# Patient Record
Sex: Female | Born: 1950 | Race: Black or African American | Hispanic: No | Marital: Single | Smoking: Never smoker
Health system: Southern US, Community
[De-identification: ages and names within clinical notes are randomized; demographics above are authoritative.]

## PROBLEM LIST (undated history)

## (undated) DIAGNOSIS — I10 Essential (primary) hypertension: Secondary | ICD-10-CM

## (undated) DIAGNOSIS — E119 Type 2 diabetes mellitus without complications: Secondary | ICD-10-CM

---

## 2019-12-19 ENCOUNTER — Emergency Department (HOSPITAL_BASED_OUTPATIENT_CLINIC_OR_DEPARTMENT_OTHER): Payer: Medicare Other

## 2019-12-19 ENCOUNTER — Inpatient Hospital Stay (HOSPITAL_BASED_OUTPATIENT_CLINIC_OR_DEPARTMENT_OTHER)
Admission: EM | Admit: 2019-12-19 | Discharge: 2019-12-21 | DRG: 638 | Disposition: A | Discharge status: Home / Self Care | Payer: Medicare Other | Attending: Internal Medicine | Admitting: Internal Medicine

## 2019-12-19 ENCOUNTER — Encounter (HOSPITAL_BASED_OUTPATIENT_CLINIC_OR_DEPARTMENT_OTHER): Payer: Self-pay | Admitting: *Deleted

## 2019-12-19 DIAGNOSIS — I422 Other hypertrophic cardiomyopathy: Secondary | ICD-10-CM | POA: Diagnosis not present

## 2019-12-19 DIAGNOSIS — Z91128 Patient's intentional underdosing of medication regimen for other reason: Secondary | ICD-10-CM

## 2019-12-19 DIAGNOSIS — E860 Dehydration: Secondary | ICD-10-CM | POA: Diagnosis not present

## 2019-12-19 DIAGNOSIS — E876 Hypokalemia: Secondary | ICD-10-CM | POA: Diagnosis present

## 2019-12-19 DIAGNOSIS — N390 Urinary tract infection, site not specified: Secondary | ICD-10-CM | POA: Diagnosis present

## 2019-12-19 DIAGNOSIS — T383X6A Underdosing of insulin and oral hypoglycemic [antidiabetic] drugs, initial encounter: Secondary | ICD-10-CM | POA: Diagnosis present

## 2019-12-19 DIAGNOSIS — R778 Other specified abnormalities of plasma proteins: Secondary | ICD-10-CM | POA: Diagnosis present

## 2019-12-19 DIAGNOSIS — R7989 Other specified abnormal findings of blood chemistry: Secondary | ICD-10-CM | POA: Diagnosis present

## 2019-12-19 DIAGNOSIS — J9811 Atelectasis: Secondary | ICD-10-CM | POA: Diagnosis not present

## 2019-12-19 DIAGNOSIS — Z20822 Contact with and (suspected) exposure to covid-19: Secondary | ICD-10-CM | POA: Diagnosis present

## 2019-12-19 DIAGNOSIS — N179 Acute kidney failure, unspecified: Secondary | ICD-10-CM | POA: Diagnosis present

## 2019-12-19 DIAGNOSIS — I10 Essential (primary) hypertension: Secondary | ICD-10-CM | POA: Diagnosis not present

## 2019-12-19 DIAGNOSIS — E1110 Type 2 diabetes mellitus with ketoacidosis without coma: Secondary | ICD-10-CM | POA: Diagnosis present

## 2019-12-19 DIAGNOSIS — R9389 Abnormal findings on diagnostic imaging of other specified body structures: Secondary | ICD-10-CM

## 2019-12-19 DIAGNOSIS — T465X6A Underdosing of other antihypertensive drugs, initial encounter: Secondary | ICD-10-CM | POA: Diagnosis present

## 2019-12-19 DIAGNOSIS — I160 Hypertensive urgency: Secondary | ICD-10-CM | POA: Diagnosis not present

## 2019-12-19 DIAGNOSIS — Z9114 Patient's other noncompliance with medication regimen: Secondary | ICD-10-CM

## 2019-12-19 DIAGNOSIS — E1100 Type 2 diabetes mellitus with hyperosmolarity without nonketotic hyperglycemic-hyperosmolar coma (NKHHC): Secondary | ICD-10-CM

## 2019-12-19 HISTORY — DX: Essential (primary) hypertension: I10

## 2019-12-19 HISTORY — DX: Type 2 diabetes mellitus without complications: E11.9

## 2019-12-19 LAB — URINALYSIS, ROUTINE W REFLEX MICROSCOPIC
Bilirubin Urine: NEGATIVE
Glucose, UA: >=500 mg/dL — AB
Ketones, ur: 40 mg/dL — AB
Nitrite: NEGATIVE
Protein, ur: NEGATIVE mg/dL
Specific Gravity, Urine: 1.015 (ref 1.005–1.030)
pH: 5.5 (ref 5.0–8.0)

## 2019-12-19 LAB — OSMOLALITY: Osmolality: 319 mOsm/kg — ABNORMAL HIGH (ref 275–295)

## 2019-12-19 LAB — COMPREHENSIVE METABOLIC PANEL
ALT: 35 U/L (ref 0–44)
AST: 19 U/L (ref 15–41)
Albumin: 3.7 g/dL (ref 3.5–5.0)
Alkaline Phosphatase: 54 U/L (ref 38–126)
Anion gap: 21 — ABNORMAL HIGH (ref 5–15)
BUN: 33 mg/dL — ABNORMAL HIGH (ref 8–23)
CO2: 23 mmol/L (ref 22–32)
Calcium: 10.3 mg/dL (ref 8.9–10.3)
Chloride: 95 mmol/L — ABNORMAL LOW (ref 98–111)
Creatinine, Ser: 1.65 mg/dL — ABNORMAL HIGH (ref 0.44–1.00)
GFR calc Af Amer: 37 mL/min — ABNORMAL LOW (ref >60)
GFR calc non Af Amer: 32 mL/min — ABNORMAL LOW (ref >60)
Glucose, Bld: 592 mg/dL (ref 70–99)
Potassium: 3.7 mmol/L (ref 3.5–5.1)
Sodium: 139 mmol/L (ref 135–145)
Total Bilirubin: 1.3 mg/dL — ABNORMAL HIGH (ref 0.3–1.2)
Total Protein: 6.5 g/dL (ref 6.5–8.1)

## 2019-12-19 LAB — POCT I-STAT EG7
Acid-Base Excess: 3 mmol/L — ABNORMAL HIGH (ref 0.0–2.0)
Bicarbonate: 27.8 mmol/L (ref 20.0–28.0)
Calcium, Ion: 1.16 mmol/L (ref 1.15–1.40)
HCT: 49 % — ABNORMAL HIGH (ref 36.0–46.0)
Hemoglobin: 16.7 g/dL — ABNORMAL HIGH (ref 12.0–15.0)
O2 Saturation: 81 %
Patient temperature: 98.4
Potassium: 5.6 mmol/L — ABNORMAL HIGH (ref 3.5–5.1)
Sodium: 135 mmol/L (ref 135–145)
TCO2: 29 mmol/L (ref 22–32)
pCO2, Ven: 41.8 mmHg — ABNORMAL LOW (ref 44.0–60.0)
pH, Ven: 7.429 (ref 7.250–7.430)
pO2, Ven: 44 mmHg (ref 32.0–45.0)

## 2019-12-19 LAB — CBC WITH DIFFERENTIAL/PLATELET
Abs Immature Granulocytes: 0.06 10*3/uL (ref 0.00–0.07)
Basophils Absolute: 0 10*3/uL (ref 0.0–0.1)
Basophils Relative: 0 %
Eosinophils Absolute: 0 10*3/uL (ref 0.0–0.5)
Eosinophils Relative: 0 %
HCT: 45.4 % (ref 36.0–46.0)
Hemoglobin: 15 g/dL (ref 12.0–15.0)
Immature Granulocytes: 1 %
Lymphocytes Relative: 8 %
Lymphs Abs: 0.6 10*3/uL — ABNORMAL LOW (ref 0.7–4.0)
MCH: 31.1 pg (ref 26.0–34.0)
MCHC: 33 g/dL (ref 30.0–36.0)
MCV: 94 fL (ref 80.0–100.0)
Monocytes Absolute: 0.6 10*3/uL (ref 0.1–1.0)
Monocytes Relative: 9 %
Neutro Abs: 6 10*3/uL (ref 1.7–7.7)
Neutrophils Relative %: 82 %
Platelets: 110 10*3/uL — ABNORMAL LOW (ref 150–400)
RBC: 4.83 MIL/uL (ref 3.87–5.11)
RDW: 14.2 % (ref 11.5–15.5)
WBC: 7.3 10*3/uL (ref 4.0–10.5)
nRBC: 0 % (ref 0.0–0.2)

## 2019-12-19 LAB — LACTIC ACID, PLASMA
Lactic Acid, Venous: 2.6 mmol/L (ref 0.5–1.9)
Lactic Acid, Venous: 2.2 mmol/L (ref 0.5–1.9)

## 2019-12-19 LAB — CBG MONITORING, ED
Glucose-Capillary: 258 mg/dL — ABNORMAL HIGH (ref 70–99)
Glucose-Capillary: 259 mg/dL — ABNORMAL HIGH (ref 70–99)
Glucose-Capillary: 387 mg/dL — ABNORMAL HIGH (ref 70–99)
Glucose-Capillary: 464 mg/dL — ABNORMAL HIGH (ref 70–99)
Glucose-Capillary: 525 mg/dL (ref 70–99)
Glucose-Capillary: 591 mg/dL (ref 70–99)

## 2019-12-19 LAB — BASIC METABOLIC PANEL
Anion gap: 19 — ABNORMAL HIGH (ref 5–15)
BUN: 28 mg/dL — ABNORMAL HIGH (ref 8–23)
CO2: 24 mmol/L (ref 22–32)
Calcium: 9.5 mg/dL (ref 8.9–10.3)
Chloride: 99 mmol/L (ref 98–111)
Creatinine, Ser: 1.31 mg/dL — ABNORMAL HIGH (ref 0.44–1.00)
GFR calc Af Amer: 48 mL/min — ABNORMAL LOW (ref >60)
GFR calc non Af Amer: 42 mL/min — ABNORMAL LOW (ref >60)
Glucose, Bld: 355 mg/dL — ABNORMAL HIGH (ref 70–99)
Potassium: 3.9 mmol/L (ref 3.5–5.1)
Sodium: 142 mmol/L (ref 135–145)

## 2019-12-19 LAB — SARS CORONAVIRUS 2 AG (30 MIN TAT): SARS Coronavirus 2 Ag: NEGATIVE

## 2019-12-19 LAB — TROPONIN I (HIGH SENSITIVITY): Troponin I (High Sensitivity): 56 ng/L — ABNORMAL HIGH (ref <18)

## 2019-12-19 LAB — URINALYSIS, MICROSCOPIC (REFLEX)

## 2019-12-19 LAB — BRAIN NATRIURETIC PEPTIDE: B Natriuretic Peptide: 50.1 pg/mL (ref 0.0–100.0)

## 2019-12-19 MED ORDER — SODIUM CHLORIDE 0.9 % IV BOLUS
1000.0000 mL | Freq: Once | INTRAVENOUS | Status: AC
  Administered 2019-12-19: 1000 mL via INTRAVENOUS

## 2019-12-19 MED ORDER — LABETALOL HCL 5 MG/ML IV SOLN
10.0000 mg | Freq: Once | INTRAVENOUS | Status: AC
  Administered 2019-12-19: 10 mg via INTRAVENOUS
  Filled 2019-12-19: qty 4

## 2019-12-19 MED ORDER — INSULIN ASPART 100 UNIT/ML ~~LOC~~ SOLN: 5.0000 [IU] | Freq: Once | SUBCUTANEOUS | Status: DC

## 2019-12-19 MED ORDER — DEXTROSE 50 % IV SOLN: 0.0000 mL | INTRAVENOUS | Status: DC | PRN

## 2019-12-19 MED ORDER — CEPHALEXIN 500 MG PO CAPS
500.0000 mg | ORAL_CAPSULE | Freq: Two times a day (BID) | ORAL | Status: DC
  Administered 2019-12-19 – 2019-12-21 (×4): 500 mg via ORAL
  Filled 2019-12-19 (×4): qty 1
  Filled 2019-12-19: qty 2

## 2019-12-19 MED ORDER — INSULIN REGULAR(HUMAN) IN NACL 100-0.9 UT/100ML-% IV SOLN
INTRAVENOUS | Status: DC
  Administered 2019-12-19: 5.5 [IU]/h via INTRAVENOUS
  Filled 2019-12-19: qty 100

## 2019-12-19 MED ORDER — DEXTROSE-NACL 5-0.45 % IV SOLN: INTRAVENOUS | Status: DC

## 2019-12-19 MED ORDER — POTASSIUM CHLORIDE 10 MEQ/100ML IV SOLN
10.0000 meq | INTRAVENOUS | Status: AC
  Administered 2019-12-19 (×2): 10 meq via INTRAVENOUS
  Filled 2019-12-19 (×2): qty 100

## 2019-12-19 MED ORDER — SODIUM CHLORIDE 0.9 % IV SOLN: INTRAVENOUS | Status: DC

## 2019-12-19 NOTE — ED Triage Notes (Signed)
HTN, hyperglycemia x months. Noncompliant with meds. Also reports no taste x 1 week. Denies loss of sense of smell.

## 2019-12-19 NOTE — ED Notes (Signed)
[facility] to transport pt at this time. Advised [facility] that [facility] recommended Insulin to be increased to 3 units/hr.

## 2019-12-19 NOTE — ED Notes (Signed)
Date and time results received: 12/19/19 1942 (use smartphrase ".now" to insert current time)  Test: glucose Critical Value: 592  Name of Provider Notified: Dr. Clarene Duke  Orders Received? Or Actions Taken?: awaiting orders

## 2019-12-19 NOTE — ED Notes (Signed)
Date and time results received: 12/19/19 1934 (use smartphrase ".now" to insert current time)  Test: lactic Critical Value: 2.6  Name of Provider Notified: Dr. Clarene Duke  Orders Received? Or Actions Taken?: no new orders

## 2019-12-19 NOTE — ED Notes (Signed)
Attempted IV to right AC and right forearm, unable to obtain.

## 2019-12-19 NOTE — ED Provider Notes (Signed)
[facility] EMERGENCY DEPARTMENT Provider Note   CSN: [id number] Arrival date & time: 12/19/19  1731     History Chief Complaint  Patient presents with  . Hypertension    Jeanne Rose is a 69 y.o. female.  69 year old female with past medical history including hypertension and type 2 diabetes mellitus who presents with hypertension and hyperglycemia.  Daughter reports that the patient has been noncompliant with any of her medications for months, preferring to try natural remedies instead.  Daughter states that she does not believe that she has diabetes.  Recently, the patient has had polyuria and polydipsia.  Daughter thinks that she has lost a few pounds recently.  No vomiting or diarrhea.  She notes that she has not been able to taste but thinks that this is due to her severely dry mouth.  She denies any loss of smell, cough, sore throat, or fevers.  She denies any chest pain or breathing problems. She has had some LE edema recently.   The history is provided by the patient and a relative.  Hypertension       Past Medical History:  Diagnosis Date  . Diabetes mellitus without complication (HCC)   . Hypertension     There are no problems to display for this patient.   History reviewed. No pertinent surgical history.   OB History   No obstetric history on file.     History reviewed. No pertinent family history.  Social History   Tobacco Use  . Smoking status: Never Smoker  . Smokeless tobacco: Never Used  Substance Use Topics  . Alcohol use: Never  . Drug use: Never    Home Medications Prior to Admission medications   Not on File    Allergies    Patient has no known allergies.  Review of Systems   Review of Systems All other systems reviewed and are negative except that which was mentioned in HPI  Physical Exam Updated Vital Signs BP (!) 203/99 (BP Location: Left Arm)   Pulse 98   Temp 98.6 F (37 C) (Oral)   Resp 18   SpO2 99%    Physical Exam Vitals and nursing note reviewed.  Constitutional:      General: She is not in acute distress.    Appearance: She is well-developed.  HENT:     Head: Normocephalic and atraumatic.     Mouth/Throat:     Mouth: Mucous membranes are dry.  Eyes:     Conjunctiva/sclera: Conjunctivae normal.  Cardiovascular:     Rate and Rhythm: Regular rhythm. Tachycardia present.     Heart sounds: Normal heart sounds. No murmur.  Pulmonary:     Effort: Pulmonary effort is normal.     Breath sounds: Normal breath sounds.  Abdominal:     General: Bowel sounds are normal. There is no distension.     Palpations: Abdomen is soft.     Tenderness: There is no abdominal tenderness.  Musculoskeletal:     Cervical back: Neck supple.     Right lower leg: Edema present.     Left lower leg: Edema present.     Comments: Mild pitting edema BLE at ankles  Skin:    General: Skin is warm and dry.  Neurological:     Mental Status: She is alert and oriented to person, place, and time.     Comments: Fluent speech  Psychiatric:        Judgment: Judgment normal.     ED Results /  Procedures / Treatments   Labs (all labs ordered are listed, but only abnormal results are displayed) Labs Reviewed  COMPREHENSIVE METABOLIC PANEL - Abnormal; Notable for the following components:      Result Value   Chloride 95 (*)    Glucose, Bld 592 (*)    BUN 33 (*)    Creatinine, Ser 1.65 (*)    Total Bilirubin 1.3 (*)    GFR calc non Af Amer 32 (*)    GFR calc Af Amer 37 (*)    Anion gap 21 (*)    All other components within normal limits  CBC WITH DIFFERENTIAL/PLATELET - Abnormal; Notable for the following components:   Platelets 110 (*)    Lymphs Abs 0.6 (*)    All other components within normal limits  URINALYSIS, ROUTINE W REFLEX MICROSCOPIC - Abnormal; Notable for the following components:   APPearance CLOUDY (*)    Glucose, UA >=500 (*)    Hgb urine dipstick SMALL (*)    Ketones, ur 40 (*)     Leukocytes,Ua TRACE (*)    All other components within normal limits  LACTIC ACID, PLASMA - Abnormal; Notable for the following components:   Lactic Acid, Venous 2.6 (*)    All other components within normal limits  LACTIC ACID, PLASMA - Abnormal; Notable for the following components:   Lactic Acid, Venous 2.2 (*)    All other components within normal limits  URINALYSIS, MICROSCOPIC (REFLEX) - Abnormal; Notable for the following components:   Bacteria, UA MANY (*)    All other components within normal limits  BASIC METABOLIC PANEL - Abnormal; Notable for the following components:   Glucose, Bld 355 (*)    BUN 28 (*)    Creatinine, Ser 1.31 (*)    GFR calc non Af Amer 42 (*)    GFR calc Af Amer 48 (*)    Anion gap 19 (*)    All other components within normal limits  CBG MONITORING, ED - Abnormal; Notable for the following components:   Glucose-Capillary 591 (*)    All other components within normal limits  POCT I-STAT EG7 - Abnormal; Notable for the following components:   pCO2, Ven 41.8 (*)    Acid-Base Excess 3.0 (*)    Potassium 5.6 (*)    HCT 49.0 (*)    Hemoglobin 16.7 (*)    All other components within normal limits  CBG MONITORING, ED - Abnormal; Notable for the following components:   Glucose-Capillary 525 (*)    All other components within normal limits  CBG MONITORING, ED - Abnormal; Notable for the following components:   Glucose-Capillary 464 (*)    All other components within normal limits  CBG MONITORING, ED - Abnormal; Notable for the following components:   Glucose-Capillary 387 (*)    All other components within normal limits  TROPONIN I (HIGH SENSITIVITY) - Abnormal; Notable for the following components:   Troponin I (High Sensitivity) 56 (*)    All other components within normal limits  SARS CORONAVIRUS 2 AG (30 MIN TAT)  SARS CORONAVIRUS 2 (TAT 6-24 HRS)  URINE CULTURE  BRAIN NATRIURETIC PEPTIDE  OSMOLALITY  BASIC METABOLIC PANEL  BASIC METABOLIC  PANEL  BASIC METABOLIC PANEL  I-STAT VENOUS BLOOD GAS, ED    EKG None  Radiology DG Chest Port 1 View  Result Date: 12/19/2019 CLINICAL DATA:  69 year old female with hypertension and edema. EXAM: PORTABLE CHEST 1 VIEW COMPARISON:  None. FINDINGS: There is a 3.8 x 2.7  cm focal opacity in the right infrahilar region 60 which may be artifactual and related to vascular confluence. An infrahilar mass or infiltrate is not excluded. Clinical correlation and follow-up with radiograph to resolution or further evaluation with chest CT recommended. There is no pleural effusion or pneumothorax. The cardiac silhouette is within normal limits. No acute osseous pathology. IMPRESSION: Focal right infrahilar density as described. Clinical correlation is recommended. Electronically Signed   By: Elgie Collard M.D.   On: 12/19/2019 19:23    Procedures .Critical Care Performed by: Laurence Spates, MD Authorized by: Laurence Spates, MD   Critical care provider statement:    Critical care time (minutes):  30   Critical care time was exclusive of:  Separately billable procedures and treating other patients   Critical care was necessary to treat or prevent imminent or life-threatening deterioration of the following conditions:  Endocrine crisis and dehydration   Critical care was time spent personally by me on the following activities:  Development of treatment plan with patient or surrogate, evaluation of patient's response to treatment, examination of patient, obtaining history from patient or surrogate, ordering and performing treatments and interventions, ordering and review of laboratory studies, ordering and review of radiographic studies and re-evaluation of patient's condition   (including critical care time)  Medications Ordered in ED Medications - No data to display  ED Course  I have reviewed the triage vital signs and the nursing notes.  Pertinent labs & imaging results that were  available during my care of the patient were reviewed by me and considered in my medical decision making (see chart for details).    MDM Rules/Calculators/A&P                      Non-toxic on exam, alert. BP 203/99. BG 591, normal venous pH.  UA with some signs of infection and a few ketones, added urine culture and will cover for infection with Keflex.  Blood work shows creatinine 1.65 with no recent for comparison, BUN 33, anion gap 21, blood glucose 590, troponin 56 although no symptoms suggestive of ACS, lactate 2.6.  Gave the patient fluid bolus and started on insulin through Endo [facility].  Lab work suggests dehydration with HHS rather than DKA.  Chest x-ray shows right infrahilar density.  Because of her AKI, I feel it would be best to hold off on contrasted CT chest for now and hydrate the patient, waiting for improvement in kidney function before obtaining CT for better evaluation.  She has no respiratory symptoms currently.  Discussed admission with [facility] hospitalist, Dr. Toniann Fail.  Final Clinical Impression(s) / ED Diagnoses Final diagnoses:  Type 2 diabetes mellitus with hyperosmolar hyperglycemic state (HHS) (HCC)  AKI (acute kidney injury) (HCC)  Hypertensive urgency  Non compliance w medication regimen    Rx / DC Orders ED Discharge Orders    None       Katerina Zurn, Ambrose [place], MD 12/19/19 2240

## 2019-12-20 ENCOUNTER — Observation Stay (HOSPITAL_COMMUNITY): Payer: Medicare Other

## 2019-12-20 ENCOUNTER — Encounter (HOSPITAL_COMMUNITY): Payer: Self-pay | Admitting: Internal Medicine

## 2019-12-20 ENCOUNTER — Other Ambulatory Visit: Payer: Self-pay

## 2019-12-20 DIAGNOSIS — R778 Other specified abnormalities of plasma proteins: Secondary | ICD-10-CM

## 2019-12-20 DIAGNOSIS — T465X6A Underdosing of other antihypertensive drugs, initial encounter: Secondary | ICD-10-CM | POA: Diagnosis present

## 2019-12-20 DIAGNOSIS — E860 Dehydration: Secondary | ICD-10-CM

## 2019-12-20 DIAGNOSIS — I160 Hypertensive urgency: Secondary | ICD-10-CM

## 2019-12-20 DIAGNOSIS — J9811 Atelectasis: Secondary | ICD-10-CM | POA: Diagnosis present

## 2019-12-20 DIAGNOSIS — E1110 Type 2 diabetes mellitus with ketoacidosis without coma: Secondary | ICD-10-CM | POA: Diagnosis present

## 2019-12-20 DIAGNOSIS — E1165 Type 2 diabetes mellitus with hyperglycemia: Secondary | ICD-10-CM | POA: Diagnosis not present

## 2019-12-20 DIAGNOSIS — R9389 Abnormal findings on diagnostic imaging of other specified body structures: Secondary | ICD-10-CM

## 2019-12-20 DIAGNOSIS — E1100 Type 2 diabetes mellitus with hyperosmolarity without nonketotic hyperglycemic-hyperosmolar coma (NKHHC): Secondary | ICD-10-CM

## 2019-12-20 DIAGNOSIS — T383X6A Underdosing of insulin and oral hypoglycemic [antidiabetic] drugs, initial encounter: Secondary | ICD-10-CM | POA: Diagnosis present

## 2019-12-20 DIAGNOSIS — N179 Acute kidney failure, unspecified: Secondary | ICD-10-CM | POA: Diagnosis present

## 2019-12-20 DIAGNOSIS — E876 Hypokalemia: Secondary | ICD-10-CM | POA: Diagnosis present

## 2019-12-20 DIAGNOSIS — I10 Essential (primary) hypertension: Secondary | ICD-10-CM

## 2019-12-20 DIAGNOSIS — Z20822 Contact with and (suspected) exposure to covid-19: Secondary | ICD-10-CM | POA: Diagnosis present

## 2019-12-20 DIAGNOSIS — I422 Other hypertrophic cardiomyopathy: Secondary | ICD-10-CM | POA: Diagnosis present

## 2019-12-20 DIAGNOSIS — N390 Urinary tract infection, site not specified: Secondary | ICD-10-CM | POA: Diagnosis present

## 2019-12-20 DIAGNOSIS — Z91128 Patient's intentional underdosing of medication regimen for other reason: Secondary | ICD-10-CM | POA: Diagnosis not present

## 2019-12-20 DIAGNOSIS — Z9114 Patient's other noncompliance with medication regimen: Secondary | ICD-10-CM

## 2019-12-20 LAB — BASIC METABOLIC PANEL
Anion gap: 15 (ref 5–15)
Anion gap: 12 (ref 5–15)
BUN: 22 mg/dL (ref 8–23)
BUN: 20 mg/dL (ref 8–23)
CO2: 26 mmol/L (ref 22–32)
CO2: 26 mmol/L (ref 22–32)
Calcium: 9 mg/dL (ref 8.9–10.3)
Calcium: 8.8 mg/dL — ABNORMAL LOW (ref 8.9–10.3)
Chloride: 102 mmol/L (ref 98–111)
Chloride: 103 mmol/L (ref 98–111)
Creatinine, Ser: 0.91 mg/dL (ref 0.44–1.00)
Creatinine, Ser: 0.84 mg/dL (ref 0.44–1.00)
GFR calc Af Amer: >60 mL/min (ref >60)
GFR calc Af Amer: >60 mL/min (ref >60)
GFR calc non Af Amer: >60 mL/min (ref >60)
GFR calc non Af Amer: >60 mL/min (ref >60)
Glucose, Bld: 177 mg/dL — ABNORMAL HIGH (ref 70–99)
Glucose, Bld: 179 mg/dL — ABNORMAL HIGH (ref 70–99)
Potassium: 3.4 mmol/L — ABNORMAL LOW (ref 3.5–5.1)
Potassium: 3.8 mmol/L (ref 3.5–5.1)
Sodium: 143 mmol/L (ref 135–145)
Sodium: 141 mmol/L (ref 135–145)

## 2019-12-20 LAB — MAGNESIUM: Magnesium: 2 mg/dL (ref 1.7–2.4)

## 2019-12-20 LAB — GLUCOSE, CAPILLARY
Glucose-Capillary: 137 mg/dL — ABNORMAL HIGH (ref 70–99)
Glucose-Capillary: 153 mg/dL — ABNORMAL HIGH (ref 70–99)
Glucose-Capillary: 164 mg/dL — ABNORMAL HIGH (ref 70–99)
Glucose-Capillary: 169 mg/dL — ABNORMAL HIGH (ref 70–99)
Glucose-Capillary: 170 mg/dL — ABNORMAL HIGH (ref 70–99)
Glucose-Capillary: 176 mg/dL — ABNORMAL HIGH (ref 70–99)
Glucose-Capillary: 180 mg/dL — ABNORMAL HIGH (ref 70–99)
Glucose-Capillary: 187 mg/dL — ABNORMAL HIGH (ref 70–99)
Glucose-Capillary: 192 mg/dL — ABNORMAL HIGH (ref 70–99)
Glucose-Capillary: 195 mg/dL — ABNORMAL HIGH (ref 70–99)
Glucose-Capillary: 235 mg/dL — ABNORMAL HIGH (ref 70–99)
Glucose-Capillary: 292 mg/dL — ABNORMAL HIGH (ref 70–99)

## 2019-12-20 LAB — CBC WITH DIFFERENTIAL/PLATELET
Abs Immature Granulocytes: 0.07 10*3/uL (ref 0.00–0.07)
Basophils Absolute: 0 10*3/uL (ref 0.0–0.1)
Basophils Relative: 0 %
Eosinophils Absolute: 0 10*3/uL (ref 0.0–0.5)
Eosinophils Relative: 0 %
HCT: 43.1 % (ref 36.0–46.0)
Hemoglobin: 13.7 g/dL (ref 12.0–15.0)
Immature Granulocytes: 1 %
Lymphocytes Relative: 22 %
Lymphs Abs: 1.6 10*3/uL (ref 0.7–4.0)
MCH: 30.2 pg (ref 26.0–34.0)
MCHC: 31.8 g/dL (ref 30.0–36.0)
MCV: 94.9 fL (ref 80.0–100.0)
Monocytes Absolute: 0.8 10*3/uL (ref 0.1–1.0)
Monocytes Relative: 10 %
Neutro Abs: 4.9 10*3/uL (ref 1.7–7.7)
Neutrophils Relative %: 67 %
Platelets: 93 10*3/uL — ABNORMAL LOW (ref 150–400)
RBC: 4.54 MIL/uL (ref 3.87–5.11)
RDW: 14.1 % (ref 11.5–15.5)
WBC: 7.3 10*3/uL (ref 4.0–10.5)
nRBC: 0 % (ref 0.0–0.2)

## 2019-12-20 LAB — HIV ANTIBODY (ROUTINE TESTING W REFLEX): HIV Screen 4th Generation wRfx: NONREACTIVE

## 2019-12-20 LAB — LACTIC ACID, PLASMA
Lactic Acid, Venous: 1.7 mmol/L (ref 0.5–1.9)
Lactic Acid, Venous: 1.6 mmol/L (ref 0.5–1.9)

## 2019-12-20 LAB — SODIUM, URINE, RANDOM: Sodium, Ur: 174 mmol/L

## 2019-12-20 LAB — ECHOCARDIOGRAM COMPLETE
Height: 63 in
Weight: 2927.71 oz

## 2019-12-20 LAB — SARS CORONAVIRUS 2 (TAT 6-24 HRS): SARS Coronavirus 2: NEGATIVE

## 2019-12-20 LAB — TROPONIN I (HIGH SENSITIVITY)
Troponin I (High Sensitivity): 65 ng/L — ABNORMAL HIGH (ref <18)
Troponin I (High Sensitivity): 71 ng/L — ABNORMAL HIGH (ref <18)

## 2019-12-20 LAB — MRSA PCR SCREENING: MRSA by PCR: NEGATIVE

## 2019-12-20 LAB — HEMOGLOBIN A1C
Hgb A1c MFr Bld: 16.6 % — ABNORMAL HIGH (ref 4.8–5.6)
Mean Plasma Glucose: 429.72 mg/dL

## 2019-12-20 LAB — TSH: TSH: 2.359 u[IU]/mL (ref 0.350–4.500)

## 2019-12-20 LAB — PHOSPHORUS: Phosphorus: 1.8 mg/dL — ABNORMAL LOW (ref 2.5–4.6)

## 2019-12-20 LAB — BETA-HYDROXYBUTYRIC ACID: Beta-Hydroxybutyric Acid: 1.48 mmol/L — ABNORMAL HIGH (ref 0.05–0.27)

## 2019-12-20 MED ORDER — AMLODIPINE BESYLATE 10 MG PO TABS
10.0000 mg | ORAL_TABLET | Freq: Every day | ORAL | Status: DC
  Administered 2019-12-20: 10 mg via ORAL
  Filled 2019-12-20: qty 1

## 2019-12-20 MED ORDER — INSULIN GLARGINE 100 UNIT/ML ~~LOC~~ SOLN: 10.0000 [IU] | Freq: Every day | SUBCUTANEOUS | Status: DC

## 2019-12-20 MED ORDER — INSULIN REGULAR(HUMAN) IN NACL 100-0.9 UT/100ML-% IV SOLN
INTRAVENOUS | Status: DC
  Administered 2019-12-20: 1.4 [IU]/h via INTRAVENOUS

## 2019-12-20 MED ORDER — ORAL CARE MOUTH RINSE
15.0000 mL | Freq: Two times a day (BID) | OROMUCOSAL | Status: DC
  Administered 2019-12-20 (×3): 15 mL via OROMUCOSAL

## 2019-12-20 MED ORDER — ONDANSETRON HCL 4 MG/2ML IJ SOLN: 4.0000 mg | Freq: Four times a day (QID) | INTRAMUSCULAR | Status: DC | PRN

## 2019-12-20 MED ORDER — METOPROLOL TARTRATE 12.5 MG HALF TABLET
12.5000 mg | ORAL_TABLET | Freq: Two times a day (BID) | ORAL | Status: DC
  Administered 2019-12-20: 12.5 mg via ORAL
  Filled 2019-12-20: qty 1

## 2019-12-20 MED ORDER — HYDROCODONE-ACETAMINOPHEN 5-325 MG PO TABS: 1.0000 | ORAL_TABLET | ORAL | Status: DC | PRN

## 2019-12-20 MED ORDER — INSULIN GLARGINE 100 UNIT/ML ~~LOC~~ SOLN
15.0000 [IU] | Freq: Every day | SUBCUTANEOUS | Status: DC
  Administered 2019-12-21: 15 [IU] via SUBCUTANEOUS
  Filled 2019-12-20: qty 0.15

## 2019-12-20 MED ORDER — SODIUM CHLORIDE 0.9 % IV SOLN: INTRAVENOUS | Status: DC

## 2019-12-20 MED ORDER — ACETAMINOPHEN 650 MG RE SUPP: 650.0000 mg | Freq: Four times a day (QID) | RECTAL | Status: DC | PRN

## 2019-12-20 MED ORDER — SODIUM CHLORIDE 0.9% FLUSH
10.0000 mL | INTRAVENOUS | Status: DC | PRN
  Administered 2019-12-21: 10 mL

## 2019-12-20 MED ORDER — ONDANSETRON HCL 4 MG PO TABS: 4.0000 mg | ORAL_TABLET | Freq: Four times a day (QID) | ORAL | Status: DC | PRN

## 2019-12-20 MED ORDER — SODIUM PHOSPHATES 45 MMOLE/15ML IV SOLN
30.0000 mmol | Freq: Once | INTRAVENOUS | Status: AC
  Administered 2019-12-20: 30 mmol via INTRAVENOUS
  Filled 2019-12-20: qty 10

## 2019-12-20 MED ORDER — INSULIN ASPART 100 UNIT/ML ~~LOC~~ SOLN
2.0000 [IU] | Freq: Three times a day (TID) | SUBCUTANEOUS | Status: DC
  Administered 2019-12-20 – 2019-12-21 (×3): 2 [IU] via SUBCUTANEOUS

## 2019-12-20 MED ORDER — ACETAMINOPHEN 325 MG PO TABS: 650.0000 mg | ORAL_TABLET | Freq: Four times a day (QID) | ORAL | Status: DC | PRN

## 2019-12-20 MED ORDER — ENOXAPARIN SODIUM 40 MG/0.4ML ~~LOC~~ SOLN
40.0000 mg | SUBCUTANEOUS | Status: DC
  Administered 2019-12-20: 40 mg via SUBCUTANEOUS
  Filled 2019-12-20: qty 0.4

## 2019-12-20 MED ORDER — LIVING WELL WITH DIABETES BOOK
Freq: Once | Status: AC
  Filled 2019-12-20: qty 1

## 2019-12-20 MED ORDER — POTASSIUM CHLORIDE 10 MEQ/100ML IV SOLN
10.0000 meq | INTRAVENOUS | Status: AC
  Administered 2019-12-20 (×2): 10 meq via INTRAVENOUS
  Filled 2019-12-20 (×2): qty 100

## 2019-12-20 MED ORDER — INSULIN GLARGINE 100 UNIT/ML ~~LOC~~ SOLN
12.0000 [IU] | Freq: Every day | SUBCUTANEOUS | Status: DC
  Administered 2019-12-20: 12 [IU] via SUBCUTANEOUS
  Filled 2019-12-20: qty 0.12

## 2019-12-20 MED ORDER — AMLODIPINE BESYLATE 5 MG PO TABS: 5.0000 mg | ORAL_TABLET | Freq: Every day | ORAL | Status: DC

## 2019-12-20 MED ORDER — METOPROLOL SUCCINATE ER 50 MG PO TB24
50.0000 mg | ORAL_TABLET | Freq: Every day | ORAL | Status: DC
  Administered 2019-12-20 – 2019-12-21 (×2): 50 mg via ORAL
  Filled 2019-12-20 (×2): qty 1

## 2019-12-20 MED ORDER — HYDRALAZINE HCL 20 MG/ML IJ SOLN: 10.0000 mg | INTRAMUSCULAR | Status: DC | PRN

## 2019-12-20 MED ORDER — SODIUM CHLORIDE 0.9% FLUSH
3.0000 mL | Freq: Two times a day (BID) | INTRAVENOUS | Status: DC
  Administered 2019-12-20 (×2): 3 mL via INTRAVENOUS

## 2019-12-20 MED ORDER — INSULIN GLARGINE 100 UNIT/ML ~~LOC~~ SOLN
5.0000 [IU] | Freq: Once | SUBCUTANEOUS | Status: AC
  Administered 2019-12-20: 5 [IU] via SUBCUTANEOUS
  Filled 2019-12-20 (×2): qty 0.05

## 2019-12-20 MED ORDER — CHLORHEXIDINE GLUCONATE CLOTH 2 % EX PADS
6.0000 | MEDICATED_PAD | Freq: Every day | CUTANEOUS | Status: DC
  Administered 2019-12-20: 6 via TOPICAL

## 2019-12-20 MED ORDER — INSULIN ASPART 100 UNIT/ML ~~LOC~~ SOLN
0.0000 [IU] | Freq: Three times a day (TID) | SUBCUTANEOUS | Status: DC
  Administered 2019-12-20: 3 [IU] via SUBCUTANEOUS
  Administered 2019-12-20: 5 [IU] via SUBCUTANEOUS
  Administered 2019-12-20: 8 [IU] via SUBCUTANEOUS
  Administered 2019-12-21: 3 [IU] via SUBCUTANEOUS
  Administered 2019-12-21: 8 [IU] via SUBCUTANEOUS

## 2019-12-20 MED ORDER — METOPROLOL TARTRATE 5 MG/5ML IV SOLN: 5.0000 mg | INTRAVENOUS | Status: DC | PRN

## 2019-12-20 MED ORDER — METOPROLOL TARTRATE 25 MG PO TABS: 25.0000 mg | ORAL_TABLET | Freq: Two times a day (BID) | ORAL | Status: DC

## 2019-12-20 MED ORDER — DOCUSATE SODIUM 100 MG PO CAPS
100.0000 mg | ORAL_CAPSULE | Freq: Two times a day (BID) | ORAL | Status: DC
  Administered 2019-12-20 – 2019-12-21 (×2): 100 mg via ORAL
  Filled 2019-12-20 (×3): qty 1

## 2019-12-20 MED ORDER — INSULIN STARTER KIT- PEN NEEDLES (ENGLISH)
1.0000 | Freq: Once | Status: DC
  Filled 2019-12-20: qty 1

## 2019-12-20 NOTE — Consult Note (Addendum)
CARDIOLOGY CONSULT NOTE  Patient ID: Sheriann Newmann MRN: [id number] DOB/AGE: [birth year] 69 y.o.  Admit date: 12/19/2019 Referring Physician: [facility] hospitalist Reason for Consultation:  Abnormal echcoardiogram  HPI:   69 y.o. [place] female  with hypertension, uncontrolled DM, admitted with DKA. Cardiology consulted for abnormal echocardiogram and elevated HS troponin.   Patient is originally from [place], moved from [place] to [place], [place] to be with her daughter 3 months ago.  She has minimal baseline medical care, last saw her physician in [place] in September 2020.  She is not on any reliable medical treatment for her diabetes at baseline.  She has Metformin tablets, but has not been compliant with it.  She was admitted to [facility] long hospital on 12/19/2019 midnight with DKA, which has since resolved.  Today, she underwent echocardiogram which was suspicious for hypertrophic cardiomyopathy.  Troponin was elevated around 70.  Patient denies any chest pain, shortness of breath, presyncope or syncope symptoms.  She denies any known family history of hypertrophic cardiomyopathy.  Past Medical History:  Diagnosis Date  . Diabetes mellitus without complication ([place])   . Hypertension      History reviewed. No pertinent surgical history.   Family History  Problem Relation Age of Onset  . Diabetes Neg Hx      Social History: Social History   Socioeconomic History  . Marital status: Single    Spouse name: Not on file  . Number of children: Not on file  . Years of education: Not on file  . Highest education level: Not on file  Occupational History  . Not on file  Tobacco Use  . Smoking status: Never Smoker  . Smokeless tobacco: Never Used  Substance and Sexual Activity  . Alcohol use: Never  . Drug use: Never  . Sexual activity: Never  Other Topics Concern  . Not on file  Social History Narrative  . Not on file   Social Determinants of Health   Financial Resource Strain:    . Difficulty of Paying Living Expenses:   Food Insecurity:   . Worried About Charity fundraiser in the Last Year:   . Arboriculturist in the Last Year:   Transportation Needs:   . Film/video editor (Medical):   Marland Kitchen Lack of Transportation (Non-Medical):   Physical Activity:   . Days of Exercise per Week:   . Minutes of Exercise per Session:   Stress:   . Feeling of Stress :   Social Connections:   . Frequency of Communication with Friends and Family:   . Frequency of Social Gatherings with Friends and Family:   . Attends Religious Services:   . Active Member of Clubs or Organizations:   . Attends Archivist Meetings:   Marland Kitchen Marital Status:   Intimate Partner Violence:   . Fear of Current or Ex-Partner:   . Emotionally Abused:   Marland Kitchen Physically Abused:   . Sexually Abused:      No medications prior to admission.    Review of Systems  Constitution: Positive for malaise/fatigue (Now improved). Negative for decreased appetite, weight gain and weight loss.  HENT: Negative for congestion.   Eyes: Negative for visual disturbance.  Cardiovascular: Negative for chest pain, dyspnea on exertion, leg swelling, palpitations and syncope.  Respiratory: Negative for cough.   Endocrine: Positive for polydipsia (Now improved) and polyuria (Now improved). Negative for cold intolerance.  Hematologic/Lymphatic: Does not bruise/bleed easily.  Skin: Negative for itching and rash.  Musculoskeletal:  Negative for myalgias.  Gastrointestinal: Negative for abdominal pain, nausea and vomiting.  Genitourinary: Negative for dysuria.  Neurological: Negative for dizziness and weakness.  Psychiatric/Behavioral: The patient is not nervous/anxious.   All other systems reviewed and are negative.     Physical Exam: Physical Exam  Constitutional: She is oriented to person, place, and time. She appears well-developed and well-nourished. No distress.  HENT:  Head: Normocephalic and atraumatic.   Eyes: Pupils are equal, round, and reactive to light. Conjunctivae are normal.  Neck: No JVD present.  Cardiovascular: Normal rate, regular rhythm and intact distal pulses.  Murmur heard. High-pitched harsh early systolic murmur is present with a grade of 2/6 at the upper right sternal border and apex. Pulmonary/Chest: Effort normal and breath sounds normal. She has no wheezes. She has no rales.  Abdominal: Soft. Bowel sounds are normal. There is no rebound.  Musculoskeletal:        General: Edema (1+ b/l) present.  Lymphadenopathy:    She has no cervical adenopathy.  Neurological: She is alert and oriented to person, place, and time. No cranial nerve deficit.  Skin: Skin is warm and dry.  Psychiatric: She has a normal mood and affect.  Nursing note and vitals reviewed.    Labs:   Lab Results  Component Value Date   WBC 7.3 12/20/2019   HGB 13.7 12/20/2019   HCT 43.1 12/20/2019   MCV 94.9 12/20/2019   PLT 93 (L) 12/20/2019    Recent Labs  Lab 12/19/19 1900 12/19/19 2122 12/20/19 0440  NA 139   < > 141  K 3.7   < > 3.8  CL 95*   < > 103  CO2 23   < > 26  BUN 33*   < > 20  CREATININE 1.65*   < > 0.84  CALCIUM 10.3   < > 8.8*  PROT 6.5  --   --   BILITOT 1.3*  --   --   ALKPHOS 54  --   --   ALT 35  --   --   AST 19  --   --   GLUCOSE 592*   < > 179*   < > = values in this interval not displayed.    Lipid Panel  No results found for: CHOL, TRIG, HDL, CHOLHDL, VLDL, LDLCALC  BNP (last 3 results) Recent Labs    12/19/19 1900  BNP 50.1    HEMOGLOBIN A1C Lab Results  Component Value Date   HGBA1C 16.6 (H) 12/20/2019   MPG 429.72 12/20/2019    Cardiac Panel (last 3 results) No results for input(s): CKTOTAL, CKMB, RELINDX in the last 8760 hours.  Invalid input(s): TROPONINHS  No results found for: CKTOTAL, CKMB, CKMBINDEX   TSH Recent Labs    12/20/19 0440  TSH 2.359      Radiology: DG Chest 2 View  Result Date: 12/20/2019 CLINICAL DATA:   Hypertension EXAM: CHEST - 2 VIEW COMPARISON:  12/19/2019 FINDINGS: There is right basilar atelectasis. There is no pleural effusion or pneumothorax. The heart and mediastinal contours are unremarkable. There is no acute osseous abnormality. 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 The visualized skeletal structures are unremarkable. IMPRESSION: Right basilar atelectasis. Electronically Signed   By: Kathreen Devoid   On: 12/20/2019 10:34   DG Chest Port 1 View  Result Date: 12/19/2019 CLINICAL DATA:  69 year old female with hypertension and edema. EXAM: PORTABLE CHEST 1 VIEW COMPARISON:  None. FINDINGS: There is a 3.8 x 2.7  cm focal opacity in the right infrahilar region 60 which may be artifactual and related to vascular confluence. An infrahilar mass or infiltrate is not excluded. Clinical correlation and follow-up with radiograph to resolution or further evaluation with chest CT recommended. There is no pleural effusion or pneumothorax. The cardiac silhouette is within normal limits. No acute osseous pathology. IMPRESSION: Focal right infrahilar density as described. Clinical correlation is recommended. Electronically Signed   By: Anner Crete M.D.   On: 12/19/2019 19:23   ECHOCARDIOGRAM COMPLETE  Result Date: 12/20/2019    ECHOCARDIOGRAM REPORT   Patient Name:   JACQUELINA HEWINS Date of Exam: 12/20/2019 Medical Rec #:  [id number]      Height:       63.0 in Accession #:    [id number]     Weight:       183.0 lb Date of Birth:  Jul 24, 1951      BSA:          1.862 m Patient Age:    51 years       BP:           172/71 mmHg Patient Gender: F              HR:           61 bpm. Exam Location:  Inpatient Procedure: 2D Echo, Cardiac Doppler and Color Doppler Indications:    Elevated troponin  History:        Patient has no prior history of Echocardiogram examinations.                 Risk Factors:Hypertension and Diabetes.  Sonographer:    Clayton Lefort RDCS (AE) Referring Phys: [place]  1. There is severe asymmetric hypertrophy of the ventricular septum (up to 1.7 cm). There is LVOT obstruction up to 37 mmHG. There appears to be chordal SAM and no mitral valve SAM is seen. There is no posteriorly directed MR that would be expected from  [facility] either. 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 Would recommend a cardiac MRI for better clarification. Left ventricular ejection fraction, by estimation, is 70 to 75%. The left ventricle has hyperdynamic function. The left ventricle has no regional wall motion abnormalities. There is severe asymmetric left ventricular hypertrophy of the basal-septal segment. Left ventricular diastolic parameters are indeterminate.  2. Right ventricular systolic function is normal. The right ventricular size is normal. There is normal pulmonary artery systolic pressure. The estimated right ventricular systolic pressure is 80.9 mmHg.  3. The mitral valve is degenerative. No evidence of mitral valve regurgitation. No evidence of mitral stenosis.  4. The aortic valve is tricuspid. Aortic valve regurgitation is not visualized. Mild aortic valve sclerosis is present, with no evidence of aortic valve stenosis.  5. The inferior vena cava is normal in size with greater than 50% respiratory variability, suggesting right atrial pressure of 3 mmHg. FINDINGS  Left Ventricle: There is severe asymmetric hypertrophy of the ventricular septum (up to 1.7 cm). There is LVOT obstruction up to 37 mmHG. There appears to be chordal SAM and no mitral valve SAM is seen. There is no posteriorly directed MR that would be expected from [facility] either. 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 Would recommend a cardiac MRI for better clarification. Left ventricular ejection fraction, by estimation, is 70 to 75%. The left ventricle has hyperdynamic function. The left ventricle has no regional wall motion abnormalities.  The left ventricular internal cavity size was  normal in size. There is severe asymmetric left ventricular hypertrophy of the basal-septal segment. Left ventricular diastolic parameters are indeterminate. Right Ventricle: The right ventricular size is normal. No increase in right ventricular wall thickness. Right ventricular systolic function is normal. There is normal pulmonary artery systolic pressure. The tricuspid regurgitant velocity is 2.47 m/s, and  with an assumed right atrial pressure of 3 mmHg, the estimated right ventricular systolic pressure is 96.7 mmHg. Left Atrium: Left atrial size was normal in size. Right Atrium: Right atrial size was normal in size. Pericardium: Trivial pericardial effusion is present. Presence of pericardial fat pad. Mitral Valve: The mitral valve is degenerative in appearance. Mild mitral annular calcification. No evidence of mitral valve regurgitation. No evidence of mitral valve stenosis. Tricuspid Valve: The tricuspid valve is grossly normal. Tricuspid valve regurgitation is trivial. No evidence of tricuspid stenosis. Aortic Valve: The aortic valve is tricuspid. . There is mild thickening and mild calcification of the aortic valve. Aortic valve regurgitation is not visualized. Mild aortic valve sclerosis is present, with no evidence of aortic valve stenosis. There is mild thickening of the aortic valve. There is mild calcification of the aortic valve. Aortic valve mean gradient measures 15.3 mmHg. Aortic valve peak gradient measures 26.1 mmHg. Pulmonic Valve: The pulmonic valve was grossly normal. Pulmonic valve regurgitation is not visualized. No evidence of pulmonic stenosis. Aorta: The aortic root is normal in size and structure. Venous: The inferior vena cava is normal in size with greater than 50% respiratory variability, suggesting right atrial pressure of 3 mmHg. IAS/Shunts: The atrial septum is grossly normal. EKG: Rhythm strip during this exam demostrated normal sinus  rhythm and premature atrial contractions.  LEFT VENTRICLE PLAX 2D LVIDd:         4.20 cm LVIDs:         2.50 cm LV PW:         1.10 cm LV IVS:        1.40 cm LVOT diam:     1.70 cm LVOT Area:     2.27 cm  RIGHT VENTRICLE             IVC RV Basal diam:  2.90 cm     IVC diam: 1.10 cm RV S prime:     19.00 cm/s TAPSE (M-mode): 2.6 cm LEFT ATRIUM             Index       RIGHT ATRIUM           Index LA diam:        2.70 cm 1.45 cm/m  RA Area:     12.70 cm LA Vol (A2C):   63.4 ml 34.05 ml/m RA Volume:   27.20 ml  14.61 ml/m LA Vol (A4C):   49.0 ml 26.32 ml/m LA Biplane Vol: 56.9 ml 30.56 ml/m  AORTIC VALVE AV Vmax:      255.33 cm/s AV Vmean:     181.333 cm/s AV VTI:       0.490 m AV Peak Grad: 26.1 mmHg AV Mean Grad: 15.3 mmHg  AORTA Ao Root diam: 2.80 cm TRICUSPID VALVE TR Peak grad:   24.4 mmHg TR Vmax:        247.00 cm/s  SHUNTS Systemic Diam: 1.70 cm Eleonore Chiquito MD Electronically signed by Eleonore Chiquito MD Signature Date/Time: 12/20/2019/11:35:38 AM    Final     Scheduled Meds: . amLODipine  10 mg Oral Daily  . cephALEXin  500 mg Oral Q12H  .  Chlorhexidine Gluconate Cloth  6 each Topical Daily  . docusate sodium  100 mg Oral BID  . enoxaparin (LOVENOX) injection  40 mg Subcutaneous Q24H  . insulin aspart  0-15 Units Subcutaneous TID WC  . insulin aspart  2 Units Subcutaneous TID WC  . [START ON 12/21/2019] insulin glargine  15 Units Subcutaneous Daily  . insulin starter kit- pen needles  1 kit Other Once  . mouth rinse  15 mL Mouth Rinse BID  . metoprolol tartrate  12.5 mg Oral BID  . sodium chloride flush  3 mL Intravenous Q12H   Continuous Infusions: . sodium chloride 100 mL/hr at 12/20/19 0915   PRN Meds:.acetaminophen **OR** acetaminophen, dextrose, hydrALAZINE, HYDROcodone-acetaminophen, ondansetron **OR** ondansetron (ZOFRAN) IV, sodium chloride flush  CARDIAC STUDIES:  EKG 12/20/2019: Sinus rhythm with Premature atrial complexes Left axis deviation Left ventricular hypertrophy  with repolarization abnormality ( R in aVL , Cornell product ) Nonspecific ST and T wave abnormality  Echocardiogram 12/20/2019:  1. There is severe asymmetric hypertrophy of the ventricular septum (up  to 1.7 cm). There is LVOT obstruction up to 37 mmHG. There appears to be  chordal SAM and no mitral valve SAM is seen. There is no posteriorly  directed MR that would be expected from  [facility] either. Overall, these findings could represent hypertrophic  obstructive cardiomyopathy. Would recommend a cardiac MRI for better  clarification. Left ventricular ejection fraction, by estimation, is 70 to  75%. The left ventricle has hyperdynamic  function. The left ventricle has no regional wall motion abnormalities.  There is severe asymmetric left ventricular hypertrophy of the  basal-septal segment. Left ventricular diastolic parameters are  indeterminate.  2. Right ventricular systolic function is normal. The right ventricular  size is normal. There is normal pulmonary artery systolic pressure. The  estimated right ventricular systolic pressure is 44.6 mmHg.  3. The mitral valve is degenerative. No evidence of mitral valve  regurgitation. No evidence of mitral stenosis.  4. The aortic valve is tricuspid. Aortic valve regurgitation is not  visualized. Mild aortic valve sclerosis is present, with no evidence of  aortic valve stenosis.  5. The inferior vena cava is normal in size with greater than 50%  respiratory variability, suggesting right atrial pressure of 3 mmHg.    Assessment & Recommendations:  69 y.o. [place] female  with hypertension, uncontrolled DM, admitted with DKA, probable hypertrophic cardiomyopathy.  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 This is incidental finding. Patient is not symptomatic from HCM. At 86, she is low risk for serious cardiac events and does not need ICD. Recommend metoprolol succinate 50 mg daily, instead of  amlodipine, for blood pressure and heart rate control and to reduce resting LVOT gradient. Okay to continue with losartan. While she does have mild edema, this may be due to combination amlodipine, IV fluid resuscitation. I would avoid aggressive diuresis at this point. I will see her for follow up in June. I will then further discuss with her re: obtaining cardiac MRI and screening first degree relatives. Strongly recommend establishing care with PCP to manage diabetes, which remains her #1 medical issue.   Troponin elevation: Due to supply demand mismatch in the setting of hypertrophic/hypertensive cardiomyopathy, DKA.  Cardiology signing off.   Nigel Mormon, MD 12/20/2019, 7:28 PM [facility] Pager: [phone] Office: [phone] If no answer Cell [phone]

## 2019-12-20 NOTE — Progress Notes (Signed)
Initial Nutrition Assessment  DOCUMENTATION CODES:   Obesity unspecified  INTERVENTION:  - introductory diet education provided--provided handouts - will monitor for additional nutrition-related needs.    NUTRITION DIAGNOSIS:   Altered nutrition lab value related to other (see comment)(medication non-compliance) as evidenced by other (comment)(hyperglycemia (DKA) and elevated BP on admission).  GOAL:   Patient will meet greater than or equal to 90% of their needs  MONITOR:   PO intake, Labs, Weight trends  REASON FOR ASSESSMENT:   Consult Assessment of nutrition requirement/status  ASSESSMENT:   69 y.o. female with medical history significant of HTN and type 2 DM. She has been non-compliant with medications and prefers natural remedies. She does not accept that she has DM. She had polyuria, polydipsia, weight loss, and dry mouth PTA. She also reported BLE edema. BP at home was in the 200s and CBG was up to 590 mg/dl.  No intakes documented since admission. Diet advanced from CLD to Carb Modified today at 0820. Ordered breakfast for patient after visit: pancakes with sugar-free syrup, scrambled eggs, peaches, and coffee.   Patient only checks CBG at home when she is feeling unwell. She lives with her daughter. She is open to checking CBG once/day but states that someone would need to do it for her; she is unsure if her daughter would be willing to do this. She only eats 1 large meal/day, in the afternoon. She is open to splitting this meal in half to eat 2 meals/day to aid in glycemic control.   She prefers natural remedies and typically uses garlic and cinnamon to control blood sugar. She does have metformin at home but does not take it typically although she did begin taking it last week with no feeling well.   She reports UBW of 188 lb and that she weighs herself frequently. She reports that on 4/19 she weighed 173 lb. Per chart review, weight yesterday was 183 lb and no other  weight hx recorded in the chart.    Labs reviewed; HgbA1c: 16.6%, CBGs: 153-192 mg/dl, Phos: 1.8 mg/dl. Medications reviewed; 100 mg colace BID, sliding scale novolog, 10 units novolog/day, 10 mEq IV KCl x2 run 4/20 and x2 runs 4/21, 30 mmol IV NaPhos x1 run 4/21. IVF; NS @ 100 ml/hr.     NUTRITION - FOCUSED PHYSICAL EXAM:  completed; no muscle or fat wasting, mild edema to extremities.   Diet Order:   Diet Order            Diet Carb Modified Fluid consistency: Thin; Room service appropriate? Yes  Diet effective now              EDUCATION NEEDS:   Education needs have been addressed  Skin:  Skin Assessment: Reviewed RN Assessment  Last BM:  PTA/unknown  Height:   Ht Readings from Last 1 Encounters:  12/19/19 5\' 3"  (1.6 m)    Weight:   Wt Readings from Last 1 Encounters:  12/19/19 83 kg    Ideal Body Weight:  52.3 kg  BMI:  Body mass index is 32.41 kg/m.  Estimated Nutritional Needs:   Kcal:  1870-2075 kcal  Protein:  85-95 grams  Fluid:  >/= 1.8 L/day     12/21/19, MS, RD, LDN, CNSC Inpatient Clinical Dietitian RD pager # available in [facility]  After hours/weekend pager # available in [facility]

## 2019-12-20 NOTE — Progress Notes (Signed)
PROGRESS NOTE    Jeanne Rose  PVX:[id number] DOB: 1951-08-25 DOA: 12/19/2019 PCP: Patient, No Pcp Per   Brief Narrative: 69 year old with past medical history of diabetes and hypertension, noncompliance with medication presented with DKA.   Assessment & Plan:   Active Problems:   DKA (diabetic ketoacidoses) (HCC)   Elevated troponin   AKI (acute kidney injury) ([facility])   Dehydration   Accelerated hypertension  1-DKA: Present with elevated anion gap, lactic acidosis, hyperglycemia ketones in urine. Treated with IV fluids, insulin drip. Gap close. Started Lantus 15 units daily, meal coverage and sliding scale insulin. HbA1c at 16  2-elevation troponin: Could be related to hypertensive urgency Echo ordered. Hypertrophy cardiomyopathy; obstructive. Cardiology consulted.  Denies chest pain.  3-AKI: Improved with IV fluids 4-dehydration: Improved with IV fluids 5-accelerated hypertension hypertension urgency: Not taking medication.  Started Norvasc and metoprolol 6-abnormal chest x-ray with focal right infrahilar density: Repeated chest x-ray: Showed right basilar atelectasis Follow-up as an outpatient 7-UTI: Continue with Keflex follow urine culture Hypophosphatemia: Replete IV Estimated body mass index is 32.41 kg/m as calculated from the following:   Height as of this encounter: _0  (1.6 m).   Weight as of this encounter: 83 kg.   DVT prophylaxis: Lovenox Code Status: Full code Family Communication: Update daughter Disposition Plan:  Patient is from: Home Anticipated d/c date: Home in 1 or 2 days Barriers to d/c or necessity for inpatient status: Continue to control blood sugar control blood pressure and further evaluation for hypertrophic cardiomyopathy  Consultants:   Cardiology  Procedures:   None  Antimicrobials:  6  Subjective: She is feeling better, she is willing to use insulin.  She reported polyuria denies abdominal pain  Objective: Vitals:   12/20/19 0500 12/20/19 0504 12/20/19 0600 12/20/19 0700  BP: (!) 173/87 (!) 155/84 (!) 164/83 (!) 169/82  Pulse: (!) 57 74 (!) 57 73  Resp: _1 Temp:      TempSrc:      SpO2: 99% 98% 98% 98%  Weight:      Height:        Intake/Output Summary (Last 24 hours) at 12/20/2019 0733 Last data filed at 12/20/2019 0600 Gross per 24 hour  Intake 2005.5 ml  Output -  Net 2005.5 ml   Filed Weights   12/19/19 2030  Weight: 83 kg    Examination:  General exam: Appears calm and comfortable  Respiratory system: Clear to auscultation. Respiratory effort normal. Cardiovascular system: S1 & S2 heard, RRR. No JVD, murmurs, rubs, gallops or clicks. No pedal edema. Gastrointestinal system: Abdomen is nondistended, soft and nontender. No organomegaly or masses felt. Normal bowel sounds heard. Central nervous system: Alert and oriented. No focal neurological deficits. Extremities: Symmetric 5 x 5 power. Skin: No rashes, lesions or ulcers    Data Reviewed: I have personally reviewed following labs and imaging studies  CBC: Recent Labs  Lab 12/19/19 1823 12/19/19 1900 12/20/19 0440  WBC  --  7.3 7.3  NEUTROABS  --  6.0 4.9  HGB 16.7* 15.0 13.7  HCT 49.0* 45.4 43.1  MCV  --  94.0 94.9  PLT  --  110* 93*   Basic Metabolic Panel: Recent Labs  Lab 12/19/19 1823 12/19/19 1900 12/19/19 2122 12/20/19 0110 12/20/19 0440  NA 135 139 142 143 141  K 5.6* 3.7 3.9 3.4* 3.8  CL  --  95* 99 102 103  CO2  --  _2 GLUCOSE  --  592* 355* 177* 179*  BUN  --  33* 28* 22 20  CREATININE  --  1.65* 1.31* 0.91 0.84  CALCIUM  --  10.3 9.5 9.0 8.8*  MG  --   --   --   --  2.0  PHOS  --   --   --   --  1.8*   GFR: Estimated Creatinine Clearance: 65.4 mL/min (by C-G formula based on SCr of 0.84 mg/dL). Liver Function Tests: Recent Labs  Lab 12/19/19 1900  AST 19  ALT 35  ALKPHOS 54  BILITOT 1.3*  PROT 6.5  ALBUMIN 3.7   No results for input(s): LIPASE, AMYLASE in the last  168 hours. No results for input(s): AMMONIA in the last 168 hours. Coagulation Profile: No results for input(s): INR, PROTIME in the last 168 hours. Cardiac Enzymes: No results for input(s): CKTOTAL, CKMB, CKMBINDEX, TROPONINI in the last 168 hours. BNP (last 3 results) No results for input(s): PROBNP in the last 8760 hours. HbA1C: No results for input(s): HGBA1C in the last 72 hours. CBG: Recent Labs  Lab 12/20/19 0245 12/20/19 0348 12/20/19 0458 12/20/19 0559 12/20/19 0651  GLUCAP 195* 170* 164* 187* 176*   Lipid Profile: No results for input(s): CHOL, HDL, LDLCALC, TRIG, CHOLHDL, LDLDIRECT in the last 72 hours. Thyroid Function Tests: Recent Labs    12/20/19 0440  TSH 2.359   Anemia Panel: No results for input(s): VITAMINB12, FOLATE, FERRITIN, TIBC, IRON, RETICCTPCT in the last 72 hours. Sepsis Labs: Recent Labs  Lab 12/19/19 1900 12/19/19 2122 12/20/19 0110 12/20/19 0440  LATICACIDVEN 2.6* 2.2* 1.7 1.6    Recent Results (from the past 240 hour(s))  SARS Coronavirus 2 Ag (30 min TAT) - Nasal Swab (BD Veritor Kit)     Status: None   Collection Time: 12/19/19  7:14 PM   Specimen: Nasal Swab (BD Veritor Kit)  Result Value Ref Range Status   SARS Coronavirus 2 Ag NEGATIVE NEGATIVE Final    Comment: (NOTE) SARS-CoV-2 antigen NOT DETECTED.  Negative results are presumptive.  Negative results do not preclude SARS-CoV-2 infection and should not be used as the sole basis for treatment or other patient management decisions, including infection  control decisions, particularly in the presence of clinical signs and  symptoms consistent with COVID-19, or in those who have been in contact with the virus.  Negative results must be combined with clinical observations, patient history, and epidemiological information. The expected result is Negative. Fact Sheet for Patients: PodPark.tn Fact Sheet for Healthcare Providers:  GiftContent.is This test is not yet approved or cleared by the [place] FDA and  has been authorized for detection and/or diagnosis of SARS-CoV-2 by FDA under an Emergency Use Authorization (EUA).  This EUA will remain in effect (meaning this test can be used) for the duration of  the COVID-19 de claration under Section 564(b)(1) of the Act, 21 U.S.C. section 360bbb-3(b)(1), unless the authorization is terminated or revoked sooner. Performed at [facility], [address] [place], [place] [address]   SARS CORONAVIRUS 2 (TAT 6-24 HRS) Nasopharyngeal Nasopharyngeal Swab     Status: None   Collection Time: 12/19/19  9:22 PM   Specimen: Nasopharyngeal Swab  Result Value Ref Range Status   SARS Coronavirus 2 NEGATIVE NEGATIVE Final    Comment: (NOTE) SARS-CoV-2 target nucleic acids are NOT DETECTED. The SARS-CoV-2 RNA is generally detectable in upper and lower respiratory specimens during the acute phase of infection. Negative results do not preclude SARS-CoV-2  infection, do not rule out co-infections with other pathogens, and should not be used as the sole basis for treatment or other patient management decisions. Negative results must be combined with clinical observations, patient history, and epidemiological information. The expected result is Negative. Fact Sheet for Patients: SugarRoll.be Fact Sheet for Healthcare Providers: https://www.woods-mathews.com/ This test is not yet approved or cleared by the [place] FDA and  has been authorized for detection and/or diagnosis of SARS-CoV-2 by FDA under an Emergency Use Authorization (EUA). This EUA will remain  in effect (meaning this test can be used) for the duration of the COVID-19 declaration under Section 56 4(b)(1) of the Act, 21 U.S.C. section 360bbb-3(b)(1), unless the authorization is terminated or revoked sooner. Performed at [facility], [place] [address] [place], [place] [address]   MRSA PCR Screening     Status: None   Collection Time: 12/20/19 12:29 AM   Specimen: Nasal Mucosa; Nasopharyngeal  Result Value Ref Range Status   MRSA by PCR NEGATIVE NEGATIVE Final    Comment:        The GeneXpert MRSA Assay (FDA approved for NASAL specimens only), is one component of a comprehensive MRSA colonization surveillance program. It is not intended to diagnose MRSA infection nor to guide or monitor treatment for MRSA infections. Performed at [facility], [place] [address] [place], [place] [address]          Radiology Studies: [facility] Chest Port 1 View  Result Date: 12/19/2019 CLINICAL DATA:  69 year old female with hypertension and edema. EXAM: PORTABLE CHEST 1 VIEW COMPARISON:  None. FINDINGS: There is a 3.8 x 2.7 cm focal opacity in the right infrahilar region 60 which may be artifactual and related to vascular confluence. An infrahilar mass or infiltrate is not excluded. Clinical correlation and follow-up with radiograph to resolution or further evaluation with chest CT recommended. There is no pleural effusion or pneumothorax. The cardiac silhouette is within normal limits. No acute osseous pathology. IMPRESSION: Focal right infrahilar density as described. Clinical correlation is recommended. Electronically Signed   By: Anner Crete M.D.   On: 12/19/2019 19:23        Scheduled Meds: . amLODipine  5 mg Oral Daily  . cephALEXin  500 mg Oral Q12H  . Chlorhexidine Gluconate Cloth  6 each Topical Daily  . docusate sodium  100 mg Oral BID  . enoxaparin (LOVENOX) injection  40 mg Subcutaneous Q24H  . insulin aspart  0-15 Units Subcutaneous TID WC  . insulin glargine  12 Units Subcutaneous Daily  . mouth rinse  15 mL Mouth Rinse BID  . sodium chloride flush  3 mL Intravenous Q12H   Continuous Infusions: . sodium chloride 100 mL/hr at 12/20/19 0600  . insulin 1.1 mL/hr at 12/20/19 0600   . sodium phosphate  Dextrose 5% IVPB       LOS: 0 days    Time spent: 35 minutes/     Kaylub Detienne A Madysen Faircloth, MD [facility]   If 7PM-7AM, please contact night-coverage [email or web address]  12/20/2019, 7:33 AM

## 2019-12-20 NOTE — Progress Notes (Signed)
  Echocardiogram 2D Echocardiogram has been performed.  Gerda Diss 12/20/2019, 9:07 AM

## 2019-12-20 NOTE — Progress Notes (Signed)
VAST NOTE: As per the Primary RN, she attempted a failed PIV start x1, thus the reason for the consult. Advised to document failed attempt.

## 2019-12-20 NOTE — H&P (Addendum)
Jeanne Rose VFI:[id number] DOB: October 14, 1950 DOA: 12/19/2019     PCP: Patient, No Pcp Per   Outpatient Specialists:  NONE    Patient arrived to ER on 12/19/19 at 1731  Patient coming from: home Lives   With family    Chief Complaint:   Chief Complaint  Patient presents with  . Hypertension  high blood sugar  HPI: Jeanne Rose is a 69 y.o. female with medical history significant of HTN, DM2,     Presented with   hypertension and hyperglycemia  patient has been noncompliant with   her medications for months, using natural remedies instead.  Patient does not accept that she has DM, Family noted inc polyuria and polydipsia, weight loss, dry mouth, reports leg edema. no CP , no SOB BP at home in 200 Her BG was up to 590 She tried to take metformin this week but bg still stayed up usually she tries to take garlic  Infectious risk factors:  Reports  fatigue    Has  NOt been vaccinated against COVID    in house  PCR testing  Pending  No results found for: SARSCOV2NAA   Regarding pertinent Chronic problems      HTN  Not on any meds Larstatin    DM 2 - not controlled, metformin    While in ER: Noted inc Cr  AG 21 Glucose 595 Possible UTI  And evidence of DKA Given IV fluid bolus and Started on IV insulin Troponin elevated to 56 CXR initially showed possible infiltrate   Hospitalist was called for admission for DKA vs HHS  The following Work up has been ordered so far:  Orders Placed This Encounter  Procedures  . Critical Care  . SARS Coronavirus 2 Ag (30 min TAT) - Nasal Swab (BD Veritor Kit)  . SARS CORONAVIRUS 2 (TAT 6-24 HRS) Nasopharyngeal Nasopharyngeal Swab  . Urine culture  . MRSA PCR Screening  . DG Chest Port 1 View  . Comprehensive metabolic panel  . CBC with Differential  . Urinalysis, Routine w reflex microscopic  . Lactic acid, plasma  . Brain natriuretic peptide  . Urinalysis, Microscopic (reflex)  . Osmolality  . Basic metabolic  panel  . Diet NPO time specified  . Cardiac monitoring  . Initiate Carrier Fluid Protocol  . Notify physician  . If present, discontinue Insulin Pump after IV Insulin is initiated.  . Do NOT use lab glucose values in EndoTool.  If CBG meter reads "Critical High", enter 600.  Marland Kitchen Upon IV fluid bolus completion, place order for STAT BMET (LAB15) and call provider with results.  . IV bolus already initiated  . Cardiac monitoring  . Complete oral assessment tool on admission, transfer and at any change in patient condition.  . Oral care with mouthwash and mouth/lip moisturizer as needed  . Perform a daily oral assessment to evaluate integrity of the oral cavity/mucosa  . Refer to Sidebar Report - CHG cloths Sidebar  . Patient education (specify): - [facility] Daily CHG Bathing  . Consult to hospitalist  ALL PATIENTS BEING ADMITTED/HAVING PROCEDURES NEED COVID-19 SCREENING  . Droplet and Contact precautions  . Pulse oximetry, continuous  . CBG monitoring, ED  . I-Stat venous blood gas, ED  . POCT I-Stat EG7  . CBG monitoring, ED  . CBG monitoring, ED  . CBG monitoring, ED  . CBG monitoring, ED  . CBG monitoring, ED  . CBG monitoring, ED  . ED EKG  . EKG 12-Lead  .  Saline lock IV  . Insert peripheral IV  . Place in observation (patient's expected length of stay will be less than 2 midnights)     Following Medications were ordered in ER: Medications  insulin regular, human (MYXREDLIN) 100 units/ 100 mL infusion ( Intravenous Rate/Dose Verify 12/19/19 2314)  0.9 %  sodium chloride infusion ( Intravenous Rate/Dose Verify 12/19/19 2350)  dextrose 5 %-0.45 % sodium chloride infusion (has no administration in time range)  dextrose 50 % solution 0-50 mL (has no administration in time range)  cephALEXin (KEFLEX) capsule 500 mg (500 mg Oral Given 12/19/19 2042)  MEDLINE mouth rinse (has no administration in time range)  Chlorhexidine Gluconate Cloth 2 % PADS 6 each (has no administration in time  range)  sodium chloride 0.9 % bolus 1,000 mL (0 mLs Intravenous Stopped 12/19/19 2053)  potassium chloride 10 mEq in 100 mL IVPB ( Intravenous Stopped 12/19/19 2236)  labetalol (NORMODYNE) injection 10 mg (10 mg Intravenous Given 12/19/19 2056)        Consult Orders  (From admission, onward)         Start     Ordered   12/19/19 2042  Consult to hospitalist  ALL PATIENTS BEING ADMITTED/HAVING PROCEDURES NEED COVID-19 SCREENING [facility] spoke with Baxter Flattery  Once    Comments: ALL PATIENTS BEING ADMITTED/HAVING PROCEDURES NEED COVID-19 SCREENING  Provider:  (Not yet assigned)  Question Answer Comment  Place call to: [facility]   Reason for Consult Admit      12/19/19 2041          Significant initial  Findings: Abnormal Labs Reviewed  COMPREHENSIVE METABOLIC PANEL - Abnormal; Notable for the following components:      Result Value   Chloride 95 (*)    Glucose, Bld 592 (*)    BUN 33 (*)    Creatinine, Ser 1.65 (*)    Total Bilirubin 1.3 (*)    GFR calc non Af Amer 32 (*)    GFR calc Af Amer 37 (*)    Anion gap 21 (*)    All other components within normal limits  CBC WITH DIFFERENTIAL/PLATELET - Abnormal; Notable for the following components:   Platelets 110 (*)    Lymphs Abs 0.6 (*)    All other components within normal limits  URINALYSIS, ROUTINE W REFLEX MICROSCOPIC - Abnormal; Notable for the following components:   APPearance CLOUDY (*)    Glucose, UA >=500 (*)    Hgb urine dipstick SMALL (*)    Ketones, ur 40 (*)    Leukocytes,Ua TRACE (*)    All other components within normal limits  LACTIC ACID, PLASMA - Abnormal; Notable for the following components:   Lactic Acid, Venous 2.6 (*)    All other components within normal limits  LACTIC ACID, PLASMA - Abnormal; Notable for the following components:   Lactic Acid, Venous 2.2 (*)    All other components within normal limits  URINALYSIS, MICROSCOPIC (REFLEX) - Abnormal; Notable for the following components:     Bacteria, UA MANY (*)    All other components within normal limits  OSMOLALITY - Abnormal; Notable for the following components:   Osmolality 319 (*)    All other components within normal limits  BASIC METABOLIC PANEL - Abnormal; Notable for the following components:   Glucose, Bld 355 (*)    BUN 28 (*)    Creatinine, Ser 1.31 (*)    GFR calc non Af Amer 42 (*)    GFR calc Af  Amer 48 (*)    Anion gap 19 (*)    All other components within normal limits  CBG MONITORING, ED - Abnormal; Notable for the following components:   Glucose-Capillary 591 (*)    All other components within normal limits  POCT I-STAT EG7 - Abnormal; Notable for the following components:   pCO2, Ven 41.8 (*)    Acid-Base Excess 3.0 (*)    Potassium 5.6 (*)    HCT 49.0 (*)    Hemoglobin 16.7 (*)    All other components within normal limits  CBG MONITORING, ED - Abnormal; Notable for the following components:   Glucose-Capillary 525 (*)    All other components within normal limits  CBG MONITORING, ED - Abnormal; Notable for the following components:   Glucose-Capillary 464 (*)    All other components within normal limits  CBG MONITORING, ED - Abnormal; Notable for the following components:   Glucose-Capillary 387 (*)    All other components within normal limits  CBG MONITORING, ED - Abnormal; Notable for the following components:   Glucose-Capillary 259 (*)    All other components within normal limits  CBG MONITORING, ED - Abnormal; Notable for the following components:   Glucose-Capillary 258 (*)    All other components within normal limits  TROPONIN I (HIGH SENSITIVITY) - Abnormal; Notable for the following components:   Troponin I (High Sensitivity) 56 (*)    All other components within normal limits    Otherwise labs showing:    Recent Labs  Lab 12/19/19 1823 12/19/19 1900 12/19/19 2122  NA 135 139 142  K 5.6* 3.7 3.9  CO2  --  23 24  GLUCOSE  --  592* 355*  BUN  --  33* 28*  CREATININE   --  1.65* 1.31*  CALCIUM  --  10.3 9.5    Cr  Lab Results  Component Value Date   CREATININE 1.31 (H) 12/19/2019   CREATININE 1.65 (H) 12/19/2019    Recent Labs  Lab 12/19/19 1900  AST 19  ALT 35  ALKPHOS 54  BILITOT 1.3*  PROT 6.5  ALBUMIN 3.7   Lab Results  Component Value Date   CALCIUM 9.5 12/19/2019    WBC      Component Value Date/Time   WBC 7.3 12/19/2019 1900   ANC    Component Value Date/Time   NEUTROABS 6.0 12/19/2019 1900   ALC No components found for: LYMPHAB    Plt: Lab Results  Component Value Date   PLT 110 (L) 12/19/2019    Lactic Acid, Venous    Component Value Date/Time   LATICACIDVEN 2.2 (HH) 12/19/2019 2122       COVID-19 Labs  No results for input(s): DDIMER, FERRITIN, LDH, CRP in the last 72 hours.  No results found for: SARSCOV2NAA    Venous  Blood Gas result:  PH 7.429 pCO2 41;        HG/HCT   stable,       Component Value Date/Time   HGB 15.0 12/19/2019 1900   HCT 45.4 12/19/2019 1900      Troponin 56 Cardiac Panel (last 3 results) No results for input(s): CKTOTAL, CKMB, TROPONINI, RELINDX in the last 72 hours.     ECG: Ordered Personally reviewed by me showing: HR : 100 Rhythm: NSR,   no evidence of ischemic changes QTC 431   BNP (last 3 results) Recent Labs    12/19/19 1900  BNP 50.1    ProBNP (last 3 results) No  results for input(s): PROBNP in the last 8760 hours.  DM  labs:  HbA1C: No results for input(s): HGBA1C in the last 8760 hours.     CBG (last 3)  Recent Labs    12/19/19 2246 12/19/19 2347 12/20/19 0040  GLUCAP 259* 258* 169*       UA  evidence of UTI       Urine analysis:    Component Value Date/Time   COLORURINE YELLOW 12/19/2019 1937   APPEARANCEUR CLOUDY (A) 12/19/2019 1937   LABSPEC 1.015 12/19/2019 1937   PHURINE 5.5 12/19/2019 1937   GLUCOSEU >=500 (A) 12/19/2019 1937   HGBUR SMALL (A) 12/19/2019 1937   BILIRUBINUR NEGATIVE 12/19/2019 1937   KETONESUR 40  (A) 12/19/2019 1937   PROTEINUR NEGATIVE 12/19/2019 1937   NITRITE NEGATIVE 12/19/2019 1937   LEUKOCYTESUR TRACE (A) 12/19/2019 1937      Ordered     CXR - focal infrahilar area unclear significance     ED Triage Vitals  Enc Vitals Group     BP 12/19/19 1743 (!) 203/99     Pulse Rate 12/19/19 1743 98     Resp 12/19/19 1743 18     Temp 12/19/19 1743 98.6 F (37 C)     Temp Source 12/19/19 1743 Oral     SpO2 12/19/19 1743 99 %     Weight 12/19/19 2030 182 lb 15.7 oz (83 kg)     Height 12/19/19 2030 5' 3"  (1.6 m)     Head Circumference --      Peak Flow --      Pain Score 12/19/19 1738 0     Pain Loc --      Pain Edu? --      Excl. in [place]? --   TMAX(24)@       Latest  Blood pressure (!) 175/96, pulse 71, temperature 98.6 F (37 C), temperature source Oral, resp. rate 11, height 5' 3"  (1.6 m), weight 83 kg, SpO2 99 %.      Review of Systems:    Pertinent positives include:  fatigue, weight loss   Constitutional:  No weight loss, night sweats, Fevers, chills, HEENT:  No headaches, Difficulty swallowing,Tooth/dental problems,Sore throat,  No sneezing, itching, ear ache, nasal congestion, post nasal drip,  Cardio-vascular:  No chest pain, Orthopnea, PND, anasarca, dizziness, palpitations.no Bilateral lower extremity swelling  GI:  No heartburn, indigestion, abdominal pain, nausea, vomiting, diarrhea, change in bowel habits, loss of appetite, melena, blood in stool, hematemesis Resp:  no shortness of breath at rest. No dyspnea on exertion, No excess mucus, no productive cough, No non-productive cough, No coughing up of blood.No change in color of mucus.No wheezing. Skin:  no rash or lesions. No jaundice GU:  no dysuria, change in color of urine, no urgency or frequency. No straining to urinate.  No flank pain.  Musculoskeletal:  No joint pain or no joint swelling. No decreased range of motion. No back pain.  Psych:  No change in mood or affect. No depression or  anxiety. No memory loss.  Neuro: no localizing neurological complaints, no tingling, no weakness, no double vision, no gait abnormality, no slurred speech, no confusion  All systems reviewed and apart from [place] all are negative  Past Medical History:   Past Medical History:  Diagnosis Date  . Diabetes mellitus without complication ([place])   . Hypertension     History reviewed. No pertinent surgical history.  Social History:  Ambulatory   Independently  reports that she has never smoked. She has never used smokeless tobacco. She reports that she does not drink alcohol or use drugs.   Family History:   Family History  Problem Relation Age of Onset  . Diabetes Neg Hx     Allergies: No Known Allergies   Prior to Admission medications   Not on File   Physical Exam: Blood pressure (!) 175/96, pulse 71, temperature 98.6 F (37 C), temperature source Oral, resp. rate 11, height 5' 3"  (1.6 m), weight 83 kg, SpO2 99 %. 1. General:  in No  Acute distress    Chronically ill  -appearing 2. Psychological: Alert and Oriented 3. Head/ENT:  Dry Mucous Membranes                          Head Non traumatic, neck supple                            Poor Dentition 4. SKIN:  decreased Skin turgor,  Skin clean Dry and intact no rash 5. Heart: Regular rate and rhythm no  Murmur, no Rub or gallop 6. Lungs:  no wheezes or crackles   7. Abdomen: Soft non-tender, Non distended bowel sounds present 8. Lower extremities: no clubbing, cyanosis,trace edema 9. Neurologically Grossly intact, moving all 4 extremities equally  10. MSK: Normal range of motion   All other LABS:     Recent Labs  Lab 12/19/19 1823 12/19/19 1900  WBC  --  7.3  NEUTROABS  --  6.0  HGB 16.7* 15.0  HCT 49.0* 45.4  MCV  --  94.0  PLT  --  110*     Recent Labs  Lab 12/19/19 1823 12/19/19 1900 12/19/19 2122  NA 135 139 142  K 5.6* 3.7 3.9  CL  --  95* 99  CO2  --  23 24  GLUCOSE  --  592* 355*  BUN   --  33* 28*  CREATININE  --  1.65* 1.31*  CALCIUM  --  10.3 9.5     Recent Labs  Lab 12/19/19 1900  AST 19  ALT 35  ALKPHOS 54  BILITOT 1.3*  PROT 6.5  ALBUMIN 3.7       Cultures: No results found for: SDES, SPECREQUEST, CULT, REPTSTATUS   Radiological Exams on Admission: DG Chest Port 1 View  Result Date: 12/19/2019 CLINICAL DATA:  69 year old female with hypertension and edema. EXAM: PORTABLE CHEST 1 VIEW COMPARISON:  None. FINDINGS: There is a 3.8 x 2.7 cm focal opacity in the right infrahilar region 60 which may be artifactual and related to vascular confluence. An infrahilar mass or infiltrate is not excluded. Clinical correlation and follow-up with radiograph to resolution or further evaluation with chest CT recommended. There is no pleural effusion or pneumothorax. The cardiac silhouette is within normal limits. No acute osseous pathology. IMPRESSION: Focal right infrahilar density as described. Clinical correlation is recommended. Electronically Signed   By: Anner Crete M.D.   On: 12/19/2019 19:23    Chart has been reviewed   Assessment/Plan  69 y.o. female with medical history significant of HTN, DM2,     Admitted for DKA/HHS and UTI  Present on Admission: . DKA (diabetic ketoacidoses) (HCC) vs HHS - small amount of ketones in urine, VBG showing well compensated Acidosis, normal bicarb Persistent elevated AG more likely due to lactic acidosis hold metformin in the setting of AKI and  lactic acidosis  . Elevated troponin -  -no chest pain no EKG changes in the setting of  likely due to demand ischemia and poor clearance, monitor on telemetry and cycle cardiac enzymes to trend. Obtain echo  if continues to rise will need further work-up  . AKI (acute kidney injury) ([place]) - will rehydrate, check urine electrolytes Improving with IVF  . Dehydration -will rehydrate   . Accelerated hypertension vs hypertensive urgency-hold ARB given AKI.  Patient is not taking  her medications persistently.  Noted elevated troponin.  Continue to cycle cardiac enzymes Start on Norvasc and as needed hydralazine for tonight   Uti -in emergency department started with Keflex p.o. await results of urine culture continue antibiotics for now and adjust as needed Patient did endorse polyuria this could have been due to her diabetes as well  Abnormal chest x-ray -will obtain repeat two-view imaging to further evaluate no evidence of coughing or fever to suggest pneumonia  given AKI hold of on CT will Order 2v if still evidence of abnormality can obtain CT when Cr improves  Other plan as per orders.  DVT prophylaxis:  SCD    Code Status:  FULL CODE  as per patient   I had personally discussed CODE STATUS with patient   Family Communication:   Family not at  Bedside  attempted to call daughter twice but no answer  Disposition Plan:  To home once workup is complete and patient is stable    Following barriers for discharge:                            Electrolytes corrected                              Will need to be able to tolerate PO                         Lactic acidosis to resolve                             EXPECT DC tomorrow                                      Diabetes care coordinator                   Transition of care consulted                   Nutrition    consulted                                   Consults called: none    Admission status:  ED Disposition    ED Disposition Condition [place]: [place] [[id number]]  Level of Care: Stepdown [14]  Admit to SDU based on following criteria: Severe physiological/psychological symptoms:  Any diagnosis requiring assessment & intervention at least every 4 hours on an ongoing basis to obtain desired patient outcomes including stability and rehabilitation  Covid Evaluation: Asymptomatic Screening Protocol (No Symptoms)  Diagnosis: DKA (diabetic ketoacidoses)  [facility]) [[id number]]  Admitting Physician: Rise Patience [phone]  Attending  Physician: Rise Patience [[id number]]       Obs      Level of care       SDU tele indefinitely please discontinue once patient no longer qualifies   Precautions: admitted as ASYMPTOMATIC Droplet and Contact precautions    PPE: Used by the provider:   P100  eye Goggles,  Gloves    Arabell Neria 12/20/2019, 1:54 AM    [facility]     after 2 AM please page floor coverage PA If 7AM-7PM, please contact the day team taking care of the patient using [email or web address]   Patient was evaluated in the context of the global COVID-19 pandemic, which necessitated consideration that the patient might be at risk for infection with the SARS-CoV-2 virus that causes COVID-19. Institutional protocols and algorithms that pertain to the evaluation of patients at risk for COVID-19 are in a state of rapid change based on information released by regulatory bodies including the CDC and federal and state organizations. These policies and algorithms were followed during the patient's care.

## 2019-12-20 NOTE — Progress Notes (Addendum)
Inpatient Diabetes Program Recommendations  AACE/ADA: New Consensus Statement on Inpatient Glycemic Control (2015)  Target Ranges:  Prepandial:   less than 140 mg/dL      Peak postprandial:   less than 180 mg/dL (1-2 hours)      Critically ill patients:  140 - 180 mg/dL   Lab Results  Component Value Date   GLUCAP 192 (H) 12/20/2019   HGBA1C 16.6 (H) 12/20/2019    Review of Glycemic Control  Diabetes history: DM 2 Outpatient Diabetes medications: unclear possibly metformin in the past. Current orders for Inpatient glycemic control:  Lantus 10 units Daily Novolog 0-15 units tid  Inpatient Diabetes Program Recommendations:    Spoke with pt about her A1c level and glucose control at home. Pt reports recently she noticed her glucose trends increased into the 400-500 range.   Discussed A1c level what is means and discussed her A1c was 16.6%. Discussed glucose goals around 150 mg/dl all the time.   Discussed the need for her to be on insulin at this time. Discussed she will need to be on it for awhile until her doctor says she can either reduce the dose or come off of it and take the pills (said this a couple of times to clarify to pt).  Pt reports wanting to be shown vial and syringe versus insulin pen. Pt reports knowing how to do vial and syringe and has reported being on medication like that in the past unclear if it was insulin. Pt knew how to do the steps as I was showing her.  Briefly discussed diet as dietitian just came to see pt. Unsure what insulin is covered by her insurance may need benefits check Levemir vial versus Lantus vial.  At time of d/c: Basal insulin vial Syringes order #[id number]  Thanks,  Christena Deem RN, MSN, BC-ADM Inpatient Diabetes Coordinator Team Pager [phone] (8a-5p)

## 2019-12-21 ENCOUNTER — Other Ambulatory Visit: Payer: Self-pay

## 2019-12-21 LAB — BASIC METABOLIC PANEL
Anion gap: 9 (ref 5–15)
Anion gap: 8 (ref 5–15)
Anion gap: 8 (ref 5–15)
BUN: 16 mg/dL (ref 8–23)
BUN: 15 mg/dL (ref 8–23)
BUN: 18 mg/dL (ref 8–23)
CO2: 31 mmol/L (ref 22–32)
CO2: 30 mmol/L (ref 22–32)
CO2: 28 mmol/L (ref 22–32)
Calcium: 7.8 mg/dL — ABNORMAL LOW (ref 8.9–10.3)
Calcium: 7.5 mg/dL — ABNORMAL LOW (ref 8.9–10.3)
Calcium: 7.9 mg/dL — ABNORMAL LOW (ref 8.9–10.3)
Chloride: 100 mmol/L (ref 98–111)
Chloride: 99 mmol/L (ref 98–111)
Chloride: 103 mmol/L (ref 98–111)
Creatinine, Ser: 0.8 mg/dL (ref 0.44–1.00)
Creatinine, Ser: 0.82 mg/dL (ref 0.44–1.00)
Creatinine, Ser: 1.07 mg/dL — ABNORMAL HIGH (ref 0.44–1.00)
GFR calc Af Amer: >60 mL/min (ref >60)
GFR calc Af Amer: >60 mL/min (ref >60)
GFR calc Af Amer: >60 mL/min (ref >60)
GFR calc non Af Amer: >60 mL/min (ref >60)
GFR calc non Af Amer: >60 mL/min (ref >60)
GFR calc non Af Amer: 53 mL/min — ABNORMAL LOW (ref >60)
Glucose, Bld: 167 mg/dL — ABNORMAL HIGH (ref 70–99)
Glucose, Bld: 275 mg/dL — ABNORMAL HIGH (ref 70–99)
Glucose, Bld: 256 mg/dL — ABNORMAL HIGH (ref 70–99)
Potassium: 2.9 mmol/L — ABNORMAL LOW (ref 3.5–5.1)
Potassium: 2.6 mmol/L — CL (ref 3.5–5.1)
Potassium: 3.6 mmol/L (ref 3.5–5.1)
Sodium: 140 mmol/L (ref 135–145)
Sodium: 137 mmol/L (ref 135–145)
Sodium: 139 mmol/L (ref 135–145)

## 2019-12-21 LAB — URINE CULTURE

## 2019-12-21 LAB — CBC
HCT: 39 % (ref 36.0–46.0)
Hemoglobin: 12.7 g/dL (ref 12.0–15.0)
MCH: 30.5 pg (ref 26.0–34.0)
MCHC: 32.6 g/dL (ref 30.0–36.0)
MCV: 93.5 fL (ref 80.0–100.0)
Platelets: 96 10*3/uL — ABNORMAL LOW (ref 150–400)
RBC: 4.17 MIL/uL (ref 3.87–5.11)
RDW: 13.9 % (ref 11.5–15.5)
WBC: 6.3 10*3/uL (ref 4.0–10.5)
nRBC: 0 % (ref 0.0–0.2)

## 2019-12-21 LAB — GLUCOSE, CAPILLARY
Glucose-Capillary: 151 mg/dL — ABNORMAL HIGH (ref 70–99)
Glucose-Capillary: 274 mg/dL — ABNORMAL HIGH (ref 70–99)

## 2019-12-21 LAB — PHOSPHORUS: Phosphorus: 3.3 mg/dL (ref 2.5–4.6)

## 2019-12-21 MED ORDER — METOPROLOL SUCCINATE ER 50 MG PO TB24: 50.0000 mg | ORAL_TABLET | Freq: Every day | ORAL | 0 refills | Status: AC

## 2019-12-21 MED ORDER — CEPHALEXIN 500 MG PO CAPS: 500.0000 mg | ORAL_CAPSULE | Freq: Two times a day (BID) | ORAL | 0 refills | Status: AC

## 2019-12-21 MED ORDER — INSULIN DETEMIR 100 UNIT/ML FLEXPEN: 18.0000 [IU] | PEN_INJECTOR | Freq: Every day | SUBCUTANEOUS | 11 refills | Status: AC

## 2019-12-21 MED ORDER — INSULIN DETEMIR 100 UNIT/ML FLEXPEN: 18.0000 [IU] | PEN_INJECTOR | Freq: Every day | SUBCUTANEOUS | 11 refills | Status: DC

## 2019-12-21 MED ORDER — POTASSIUM CHLORIDE 10 MEQ/100ML IV SOLN
10.0000 meq | INTRAVENOUS | Status: AC
  Administered 2019-12-21 (×4): 10 meq via INTRAVENOUS
  Filled 2019-12-21 (×4): qty 100

## 2019-12-21 MED ORDER — LOSARTAN POTASSIUM 50 MG PO TABS: 25.0000 mg | ORAL_TABLET | Freq: Every day | ORAL | 1 refills | Status: DC

## 2019-12-21 MED ORDER — CEPHALEXIN 500 MG PO CAPS: 500.0000 mg | ORAL_CAPSULE | Freq: Two times a day (BID) | ORAL | 0 refills | Status: DC

## 2019-12-21 MED ORDER — POTASSIUM CHLORIDE CRYS ER 20 MEQ PO TBCR
40.0000 meq | EXTENDED_RELEASE_TABLET | Freq: Two times a day (BID) | ORAL | Status: DC
  Administered 2019-12-21: 40 meq via ORAL
  Filled 2019-12-21: qty 2

## 2019-12-21 MED ORDER — POTASSIUM CHLORIDE CRYS ER 20 MEQ PO TBCR: 40.0000 meq | EXTENDED_RELEASE_TABLET | Freq: Once | ORAL | 0 refills | Status: DC

## 2019-12-21 MED ORDER — POTASSIUM CHLORIDE CRYS ER 20 MEQ PO TBCR: 40.0000 meq | EXTENDED_RELEASE_TABLET | Freq: Every day | ORAL | 0 refills | Status: AC

## 2019-12-21 MED ORDER — METFORMIN HCL 500 MG PO TABS: 500.0000 mg | ORAL_TABLET | Freq: Two times a day (BID) | ORAL | 11 refills | Status: DC

## 2019-12-21 MED ORDER — POTASSIUM CHLORIDE CRYS ER 20 MEQ PO TBCR
40.0000 meq | EXTENDED_RELEASE_TABLET | Freq: Once | ORAL | Status: AC
  Administered 2019-12-21: 40 meq via ORAL
  Filled 2019-12-21: qty 2

## 2019-12-21 MED ORDER — METFORMIN HCL 500 MG PO TABS: 500.0000 mg | ORAL_TABLET | Freq: Two times a day (BID) | ORAL | 1 refills | Status: AC

## 2019-12-21 MED ORDER — POTASSIUM CHLORIDE CRYS ER 20 MEQ PO TBCR: 40.0000 meq | EXTENDED_RELEASE_TABLET | Freq: Every day | ORAL | 0 refills | Status: DC

## 2019-12-21 MED ORDER — METOPROLOL SUCCINATE ER 50 MG PO TB24: 50.0000 mg | ORAL_TABLET | Freq: Every day | ORAL | 0 refills | Status: DC

## 2019-12-21 MED ORDER — LOSARTAN POTASSIUM 50 MG PO TABS: 50.0000 mg | ORAL_TABLET | Freq: Every day | ORAL | 1 refills | Status: AC

## 2019-12-21 MED ORDER — LOSARTAN POTASSIUM 25 MG PO TABS: 25.0000 mg | ORAL_TABLET | Freq: Every day | ORAL | 11 refills | Status: DC

## 2019-12-21 NOTE — TOC Progression Note (Signed)
Transition of Care [facility]) - Progression Note    Patient Details  Name: Jeanne Rose MRN: [id number] Date of Birth: November 23, 1950  Transition of Care [facility] CM/SW Contact  Ida Rogue, [place] Phone Number: 12/21/2019, 9:22 AM  Clinical Narrative:  CSW responding to hospitalist consult for PCP.  [facility]-was told no current new patient appointments are available and to call back.  Put this information in patient AVS.  Noticed that patient has [place] address, and put [facility] in AVS for patient to follow up on as well.  TOC will continue to follow during the course of hospitalization.       Barriers to Discharge: No Barriers Identified  Expected Discharge Plan and Services                                                 Social Determinants of Health (SDOH) Interventions    Readmission Risk Interventions No flowsheet data found.

## 2019-12-21 NOTE — Discharge Summary (Signed)
Physician Discharge Summary  Jeanne Rose VXY:[id number] DOB: [birth year] DOA: 12/19/2019  PCP: Patient, No Pcp Per  Admit date: 12/19/2019 Discharge date: 12/21/2019  Admitted From: Home Disposition:  Home  Recommendations for Outpatient Follow-up:  1. Follow up with PCP in 1-2 weeks 2. Please obtain BMP/CBC in one week 3. Further adjustment of Diabetics Regimen. Adjust BP medications as needed.  4. Follow up with Cardiology for Cardiac MRI 5. She will need follow up chest x ray   Discharge Condition: CODE STATUS: Diet recommendation: Carb Modified   Brief/Interim Summary: 69 year old with past medical history of diabetes and hypertension, noncompliance with medication presented with DKA  1-DKA: Present with elevated anion gap, lactic acidosis, hyperglycemia ketones in urine. Treated with IV fluids, insulin drip. Gap close. Started on long acting insulin, meal coverage and sliding scale insulin. Discharge on Levemir 18 units HbA1c at 16 CBG better controlled.   2-elevation troponin: Could be related to hypertensive urgency Echo ordered. Hypertrophy cardiomyopathy; obstructive. Cardiology consulted.  Denies chest pain.  3-AKI: Improved with IV fluids.  4-Dehydration: Improved with IV fluids.  5-accelerated hypertension hypertension urgency: Not taking medication.  Started metoprolol. Continue with Cozaar. Cardiology recommend to discontinue Norvasc.   6-abnormal chest x-ray with focal right infrahilar density: Repeated chest x-ray: Showed right basilar atelectasis Follow-up as an outpatient  7-UTI: Continue with Keflex follow urine culture. Hypertrophic Cardiomyopathy; cardiology recommend out patient follow up with cardiac MRI.  Hypokalemia; replete orally.  Hypophosphatemia: Replete IV, normal at discharge.   Estimated body mass index is 32.41 kg/m as calculated from the following:   Height as of this encounter: _0  (1.6 m).   Weight as of this encounter: 83  kg.   Discharge Diagnoses:  Active Problems:   DKA (diabetic ketoacidoses) (HCC)   Elevated troponin   AKI (acute kidney injury) ([place])   Dehydration   Accelerated hypertension    Discharge Instructions  Discharge Instructions    Diet - low sodium heart healthy   Complete by: As directed    Increase activity slowly   Complete by: As directed      Allergies as of 12/21/2019   No Known Allergies     Medication List    TAKE these medications   cephALEXin 500 MG capsule Commonly known as: KEFLEX Take 1 capsule (500 mg total) by mouth every 12 (twelve) hours.   insulin detemir 100 UNIT/ML FlexPen Commonly known as: LEVEMIR Inject 18 Units into the skin daily.   losartan 50 MG tablet Commonly known as: Cozaar Take 0.5 tablets (25 mg total) by mouth daily.   metFORMIN 500 MG tablet Commonly known as: Glucophage Take 1 tablet (500 mg total) by mouth 2 (two) times daily with a meal.   metoprolol succinate 50 MG 24 hr tablet Commonly known as: TOPROL-XL Take 1 tablet (50 mg total) by mouth daily. Take with or immediately following a meal.   potassium chloride SA 20 MEQ tablet Commonly known as: KLOR-CON Take 2 tablets (40 mEq total) by mouth daily for 2 doses.      Follow-up [facility] Follow up.   Why: Call on Monday when they have availability for new patient appointments Contact information: [place] [address] [phone]       [facility] Follow up.   Why: Contact this clinic for an appointment Contact information:  [address], [place], [place] [address] Phone: [phone]  No Known Allergies  Consultations:  None   Procedures/Studies: DG Chest 2 View  Result Date: 12/20/2019 CLINICAL DATA:  Hypertension EXAM: CHEST - 2 VIEW COMPARISON:  12/19/2019 FINDINGS: There is right basilar atelectasis. There is no pleural effusion or  pneumothorax. The heart and mediastinal contours are unremarkable. There is no acute osseous abnormality. 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 The visualized skeletal structures are unremarkable. IMPRESSION: Right basilar atelectasis. Electronically Signed   By: Kathreen Devoid   On: 12/20/2019 10:34   DG Chest Port 1 View  Result Date: 12/19/2019 CLINICAL DATA:  69 year old female with hypertension and edema. EXAM: PORTABLE CHEST 1 VIEW COMPARISON:  None. FINDINGS: There is a 3.8 x 2.7 cm focal opacity in the right infrahilar region 60 which may be artifactual and related to vascular confluence. An infrahilar mass or infiltrate is not excluded. Clinical correlation and follow-up with radiograph to resolution or further evaluation with chest CT recommended. There is no pleural effusion or pneumothorax. The cardiac silhouette is within normal limits. No acute osseous pathology. IMPRESSION: Focal right infrahilar density as described. Clinical correlation is recommended. Electronically Signed   By: Anner Crete M.D.   On: 12/19/2019 19:23   ECHOCARDIOGRAM COMPLETE  Result Date: 12/20/2019    ECHOCARDIOGRAM REPORT   Patient Name:   Jeanne Rose Date of Exam: 12/20/2019 Medical Rec #:  [id number]      Height:       63.0 in Accession #:    [id number]     Weight:       183.0 lb Date of Birth:  1950/09/30      BSA:          1.862 m Patient Age:    66 years       BP:           172/71 mmHg Patient Gender: F              HR:           61 bpm. Exam Location:  Inpatient Procedure: 2D Echo, Cardiac Doppler and Color Doppler Indications:    Elevated troponin  History:        Patient has no prior history of Echocardiogram examinations.                 Risk Factors:Hypertension and Diabetes.  Sonographer:    Clayton Lefort RDCS (AE) Referring Phys: [place]  1. There is severe asymmetric hypertrophy of the ventricular septum (up to 1.7 cm). There is LVOT obstruction up to 37 mmHG.  There appears to be chordal SAM and no mitral valve SAM is seen. There is no posteriorly directed MR that would be expected from  [facility] either. 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 Would recommend a cardiac MRI for better clarification. Left ventricular ejection fraction, by estimation, is 70 to 75%. The left ventricle has hyperdynamic function. The left ventricle has no regional wall motion abnormalities. There is severe asymmetric left ventricular hypertrophy of the basal-septal segment. Left ventricular diastolic parameters are indeterminate.  2. Right ventricular systolic function is normal. The right ventricular size is normal. There is normal pulmonary artery systolic pressure. The estimated right ventricular systolic pressure is 82.9 mmHg.  3. The mitral valve is degenerative. No evidence of mitral valve regurgitation. No evidence of mitral stenosis.  4. The aortic valve is tricuspid. Aortic valve regurgitation is not visualized. Mild aortic valve sclerosis is present, with no evidence of aortic valve stenosis.  5. The inferior vena cava is normal in size with greater than 50% respiratory variability, suggesting right atrial pressure of 3 mmHg. FINDINGS  Left Ventricle: There is severe asymmetric hypertrophy of the ventricular septum (up to 1.7 cm). There is LVOT obstruction up to 37 mmHG. There appears to be chordal SAM and no mitral valve SAM is seen. There is no posteriorly directed MR that would be expected from [facility] either. 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 Would recommend a cardiac MRI for better clarification. Left ventricular ejection fraction, by estimation, is 70 to 75%. The left ventricle has hyperdynamic function. The left ventricle has no regional wall motion abnormalities. The left ventricular internal cavity size was normal in size. There is severe asymmetric left ventricular hypertrophy of the basal-septal  segment. Left ventricular diastolic parameters are indeterminate. Right Ventricle: The right ventricular size is normal. No increase in right ventricular wall thickness. Right ventricular systolic function is normal. There is normal pulmonary artery systolic pressure. The tricuspid regurgitant velocity is 2.47 m/s, and  with an assumed right atrial pressure of 3 mmHg, the estimated right ventricular systolic pressure is 93.7 mmHg. Left Atrium: Left atrial size was normal in size. Right Atrium: Right atrial size was normal in size. Pericardium: Trivial pericardial effusion is present. Presence of pericardial fat pad. Mitral Valve: The mitral valve is degenerative in appearance. Mild mitral annular calcification. No evidence of mitral valve regurgitation. No evidence of mitral valve stenosis. Tricuspid Valve: The tricuspid valve is grossly normal. Tricuspid valve regurgitation is trivial. No evidence of tricuspid stenosis. Aortic Valve: The aortic valve is tricuspid. . There is mild thickening and mild calcification of the aortic valve. Aortic valve regurgitation is not visualized. Mild aortic valve sclerosis is present, with no evidence of aortic valve stenosis. There is mild thickening of the aortic valve. There is mild calcification of the aortic valve. Aortic valve mean gradient measures 15.3 mmHg. Aortic valve peak gradient measures 26.1 mmHg. Pulmonic Valve: The pulmonic valve was grossly normal. Pulmonic valve regurgitation is not visualized. No evidence of pulmonic stenosis. Aorta: The aortic root is normal in size and structure. Venous: The inferior vena cava is normal in size with greater than 50% respiratory variability, suggesting right atrial pressure of 3 mmHg. IAS/Shunts: The atrial septum is grossly normal. EKG: Rhythm strip during this exam demostrated normal sinus rhythm and premature atrial contractions.  LEFT VENTRICLE PLAX 2D LVIDd:         4.20 cm LVIDs:         2.50 cm LV PW:         1.10 cm LV  IVS:        1.40 cm LVOT diam:     1.70 cm LVOT Area:     2.27 cm  RIGHT VENTRICLE             IVC RV Basal diam:  2.90 cm     IVC diam: 1.10 cm RV S prime:     19.00 cm/s TAPSE (M-mode): 2.6 cm LEFT ATRIUM             Index       RIGHT ATRIUM           Index LA diam:        2.70 cm 1.45 cm/m  RA Area:     12.70 cm LA Vol (A2C):   63.4 ml 34.05 ml/m RA Volume:   27.20 ml  14.61 ml/m LA Vol (A4C):   49.0 ml  26.32 ml/m LA Biplane Vol: 56.9 ml 30.56 ml/m  AORTIC VALVE AV Vmax:      255.33 cm/s AV Vmean:     181.333 cm/s AV VTI:       0.490 m AV Peak Grad: 26.1 mmHg AV Mean Grad: 15.3 mmHg  AORTA Ao Root diam: 2.80 cm TRICUSPID VALVE TR Peak grad:   24.4 mmHg TR Vmax:        247.00 cm/s  SHUNTS Systemic Diam: 1.70 cm Eleonore Chiquito MD Electronically signed by Eleonore Chiquito MD Signature Date/Time: 12/20/2019/11:35:38 AM    Final      Subjective: Feeling well, no new complaints.   Discharge Exam: Vitals:   12/21/19 0346 12/21/19 0802  BP: 132/73 (!) 151/80  Pulse: 68 (!) 53  Resp: 16 20  Temp: 97.8 F (36.6 C) 98.2 F (36.8 C)  SpO2: 100% 100%     General: Pt is alert, awake, not in acute distress Cardiovascular: RRR, S1/S2 +, no rubs, no gallops Respiratory: CTA bilaterally, no wheezing, no rhonchi Abdominal: Soft, NT, ND, bowel sounds + Extremities: no edema, no cyanosis    The results of significant diagnostics from this hospitalization (including imaging, microbiology, ancillary and laboratory) are listed below for reference.     Microbiology: Recent Results (from the past 240 hour(s))  SARS Coronavirus 2 Ag (30 min TAT) - Nasal Swab (BD Veritor Kit)     Status: None   Collection Time: 12/19/19  7:14 PM   Specimen: Nasal Swab (BD Veritor Kit)  Result Value Ref Range Status   SARS Coronavirus 2 Ag NEGATIVE NEGATIVE Final    Comment: (NOTE) SARS-CoV-2 antigen NOT DETECTED.  Negative results are presumptive.  Negative results do not preclude SARS-CoV-2 infection and should  not be used as the sole basis for treatment or other patient management decisions, including infection  control decisions, particularly in the presence of clinical signs and  symptoms consistent with COVID-19, or in those who have been in contact with the virus.  Negative results must be combined with clinical observations, patient history, and epidemiological information. The expected result is Negative. Fact Sheet for Patients: PodPark.tn Fact Sheet for Healthcare Providers: GiftContent.is This test is not yet approved or cleared by the [place] FDA and  has been authorized for detection and/or diagnosis of SARS-CoV-2 by FDA under an Emergency Use Authorization (EUA).  This EUA will remain in effect (meaning this test can be used) for the duration of  the COVID-19 de claration under Section 564(b)(1) of the Act, 21 U.S.C. section 360bbb-3(b)(1), unless the authorization is terminated or revoked sooner. Performed at [facility], [address] [place], [place] [address]   Urine culture     Status: Abnormal   Collection Time: 12/19/19  7:37 PM   Specimen: Urine, Random  Result Value Ref Range Status   Specimen Description   Final    URINE, RANDOM Performed at [facility], [address] [place], [place] [address]    Special Requests   Final    NONE Performed at [facility], [address] [place], [place] [address]    Culture MULTIPLE SPECIES PRESENT, SUGGEST RECOLLECTION (A)  Final   Report Status 12/21/2019 FINAL  Final  SARS CORONAVIRUS 2 (TAT 6-24 HRS) Nasopharyngeal Nasopharyngeal Swab     Status: None   Collection Time: 12/19/19  9:22 PM   Specimen: Nasopharyngeal Swab  Result Value Ref Range Status   SARS Coronavirus 2 NEGATIVE  NEGATIVE Final    Comment: (NOTE) SARS-CoV-2 target nucleic acids are NOT DETECTED. The SARS-CoV-2 RNA is generally detectable in upper and  lower respiratory specimens during the acute phase of infection. Negative results do not preclude SARS-CoV-2 infection, do not rule out co-infections with other pathogens, and should not be used as the sole basis for treatment or other patient management decisions. Negative results must be combined with clinical observations, patient history, and epidemiological information. The expected result is Negative. Fact Sheet for Patients: SugarRoll.be Fact Sheet for Healthcare Providers: https://www.woods-mathews.com/ This test is not yet approved or cleared by the [place] FDA and  has been authorized for detection and/or diagnosis of SARS-CoV-2 by FDA under an Emergency Use Authorization (EUA). This EUA will remain  in effect (meaning this test can be used) for the duration of the COVID-19 declaration under Section 56 4(b)(1) of the Act, 21 U.S.C. section 360bbb-3(b)(1), unless the authorization is terminated or revoked sooner. Performed at [facility], [place] [address], [place], [place] [address]   MRSA PCR Screening     Status: None   Collection Time: 12/20/19 12:29 AM   Specimen: Nasal Mucosa; Nasopharyngeal  Result Value Ref Range Status   MRSA by PCR NEGATIVE NEGATIVE Final    Comment:        The GeneXpert MRSA Assay (FDA approved for NASAL specimens only), is one component of a comprehensive MRSA colonization surveillance program. It is not intended to diagnose MRSA infection nor to guide or monitor treatment for MRSA infections. Performed at [facility], [place] [address], [place], [place] [address]      Labs: BNP (last 3 results) Recent Labs    12/19/19 1900  BNP 61.4   Basic Metabolic Panel: Recent Labs  Lab 12/19/19 2122 12/20/19 0110 12/20/19 0440 12/21/19 0409 12/21/19 1043  NA 142 143 141 140 137  K 3.9 3.4* 3.8 2.9* 2.6*  CL 99 102 103 100 99  CO2 _0 GLUCOSE 355* 177*  179* 167* 275*  BUN 28* _1 CREATININE 1.31* 0.91 0.84 0.80 0.82  CALCIUM 9.5 9.0 8.8* 7.8* 7.5*  MG  --   --  2.0  --   --   PHOS  --   --  1.8* 3.3  --    Liver Function Tests: Recent Labs  Lab 12/19/19 1900  AST 19  ALT 35  ALKPHOS 54  BILITOT 1.3*  PROT 6.5  ALBUMIN 3.7   No results for input(s): LIPASE, AMYLASE in the last 168 hours. No results for input(s): AMMONIA in the last 168 hours. CBC: Recent Labs  Lab 12/19/19 1823 12/19/19 1900 12/20/19 0440 12/21/19 0409  WBC  --  7.3 7.3 6.3  NEUTROABS  --  6.0 4.9  --   HGB 16.7* 15.0 13.7 12.7  HCT 49.0* 45.4 43.1 39.0  MCV  --  94.0 94.9 93.5  PLT  --  110* 93* 96*   Cardiac Enzymes: No results for input(s): CKTOTAL, CKMB, CKMBINDEX, TROPONINI in the last 168 hours. BNP: Invalid input(s): POCBNP CBG: Recent Labs  Lab 12/20/19 1237 12/20/19 1626 12/20/19 2024 12/21/19 0801 12/21/19 1137  GLUCAP 235* 292* 137* 151* 274*   D-Dimer No results for input(s): DDIMER in the last 72 hours. Hgb A1c Recent Labs    12/20/19 0440  HGBA1C 16.6*   Lipid Profile No results for input(s): CHOL, HDL, LDLCALC, TRIG, CHOLHDL, LDLDIRECT in the last 72 hours. Thyroid function  studies Recent Labs    12/20/19 0440  TSH 2.359   Anemia work up No results for input(s): VITAMINB12, FOLATE, FERRITIN, TIBC, IRON, RETICCTPCT in the last 72 hours. Urinalysis    Component Value Date/Time   COLORURINE YELLOW 12/19/2019 1937   APPEARANCEUR CLOUDY (A) 12/19/2019 1937   LABSPEC 1.015 12/19/2019 1937   PHURINE 5.5 12/19/2019 1937   GLUCOSEU >=500 (A) 12/19/2019 1937   HGBUR SMALL (A) 12/19/2019 1937   BILIRUBINUR NEGATIVE 12/19/2019 1937   KETONESUR 40 (A) 12/19/2019 1937   PROTEINUR NEGATIVE 12/19/2019 1937   NITRITE NEGATIVE 12/19/2019 1937   LEUKOCYTESUR TRACE (A) 12/19/2019 1937   Sepsis Labs Invalid input(s): PROCALCITONIN,  WBC,  LACTICIDVEN Microbiology Recent Results (from the past 240 hour(s))  SARS  Coronavirus 2 Ag (30 min TAT) - Nasal Swab (BD Veritor Kit)     Status: None   Collection Time: 12/19/19  7:14 PM   Specimen: Nasal Swab (BD Veritor Kit)  Result Value Ref Range Status   SARS Coronavirus 2 Ag NEGATIVE NEGATIVE Final    Comment: (NOTE) SARS-CoV-2 antigen NOT DETECTED.  Negative results are presumptive.  Negative results do not preclude SARS-CoV-2 infection and should not be used as the sole basis for treatment or other patient management decisions, including infection  control decisions, particularly in the presence of clinical signs and  symptoms consistent with COVID-19, or in those who have been in contact with the virus.  Negative results must be combined with clinical observations, patient history, and epidemiological information. The expected result is Negative. Fact Sheet for Patients: PodPark.tn Fact Sheet for Healthcare Providers: GiftContent.is This test is not yet approved or cleared by the [place] FDA and  has been authorized for detection and/or diagnosis of SARS-CoV-2 by FDA under an Emergency Use Authorization (EUA).  This EUA will remain in effect (meaning this test can be used) for the duration of  the COVID-19 de claration under Section 564(b)(1) of the Act, 21 U.S.C. section 360bbb-3(b)(1), unless the authorization is terminated or revoked sooner. Performed at [facility], [address] [place], [place] [address]   Urine culture     Status: Abnormal   Collection Time: 12/19/19  7:37 PM   Specimen: Urine, Random  Result Value Ref Range Status   Specimen Description   Final    URINE, RANDOM Performed at [facility], [address] [place], [place] [address]    Special Requests   Final    NONE Performed at [facility], [address] [place], [place] [address]    Culture MULTIPLE SPECIES PRESENT, SUGGEST RECOLLECTION (A)  Final   Report  Status 12/21/2019 FINAL  Final  SARS CORONAVIRUS 2 (TAT 6-24 HRS) Nasopharyngeal Nasopharyngeal Swab     Status: None   Collection Time: 12/19/19  9:22 PM   Specimen: Nasopharyngeal Swab  Result Value Ref Range Status   SARS Coronavirus 2 NEGATIVE NEGATIVE Final    Comment: (NOTE) SARS-CoV-2 target nucleic acids are NOT DETECTED. The SARS-CoV-2 RNA is generally detectable in upper and lower respiratory specimens during the acute phase of infection. Negative results do not preclude SARS-CoV-2 infection, do not rule out co-infections with other pathogens, and should not be used as the sole basis for treatment or other patient management decisions. Negative results must be combined with clinical observations, patient history, and epidemiological information. The expected result is Negative. Fact Sheet for Patients: SugarRoll.be Fact Sheet for Healthcare Providers: https://www.woods-mathews.com/ This test  is not yet approved or cleared by the [place] and  has been authorized for detection and/or diagnosis of SARS-CoV-2 by FDA under an Emergency Use Authorization (EUA). This EUA will remain  in effect (meaning this test can be used) for the duration of the COVID-19 declaration under Section 56 4(b)(1) of the Act, 21 U.S.C. section 360bbb-3(b)(1), unless the authorization is terminated or revoked sooner. Performed at [facility], [place] [address] [place], [place] [address]   MRSA PCR Screening     Status: None   Collection Time: 12/20/19 12:29 AM   Specimen: Nasal Mucosa; Nasopharyngeal  Result Value Ref Range Status   MRSA by PCR NEGATIVE NEGATIVE Final    Comment:        The GeneXpert MRSA Assay (FDA approved for NASAL specimens only), is one component of a comprehensive MRSA colonization surveillance program. It is not intended to diagnose MRSA infection nor to guide or monitor treatment for MRSA infections. Performed  at [facility], [place] [address], [place], [place] [address]      Time coordinating discharge: 40 minutes  SIGNED:   Elmarie Shiley, MD  [facility]

## 2021-01-13 IMAGING — DX DG CHEST 2V
2 series · 2 of 2 positions shown · non-contrast
Comparison: 12/19/2019

CLINICAL DATA: Hypertension

EXAM:
CHEST - 2 VIEW

[chest pa]
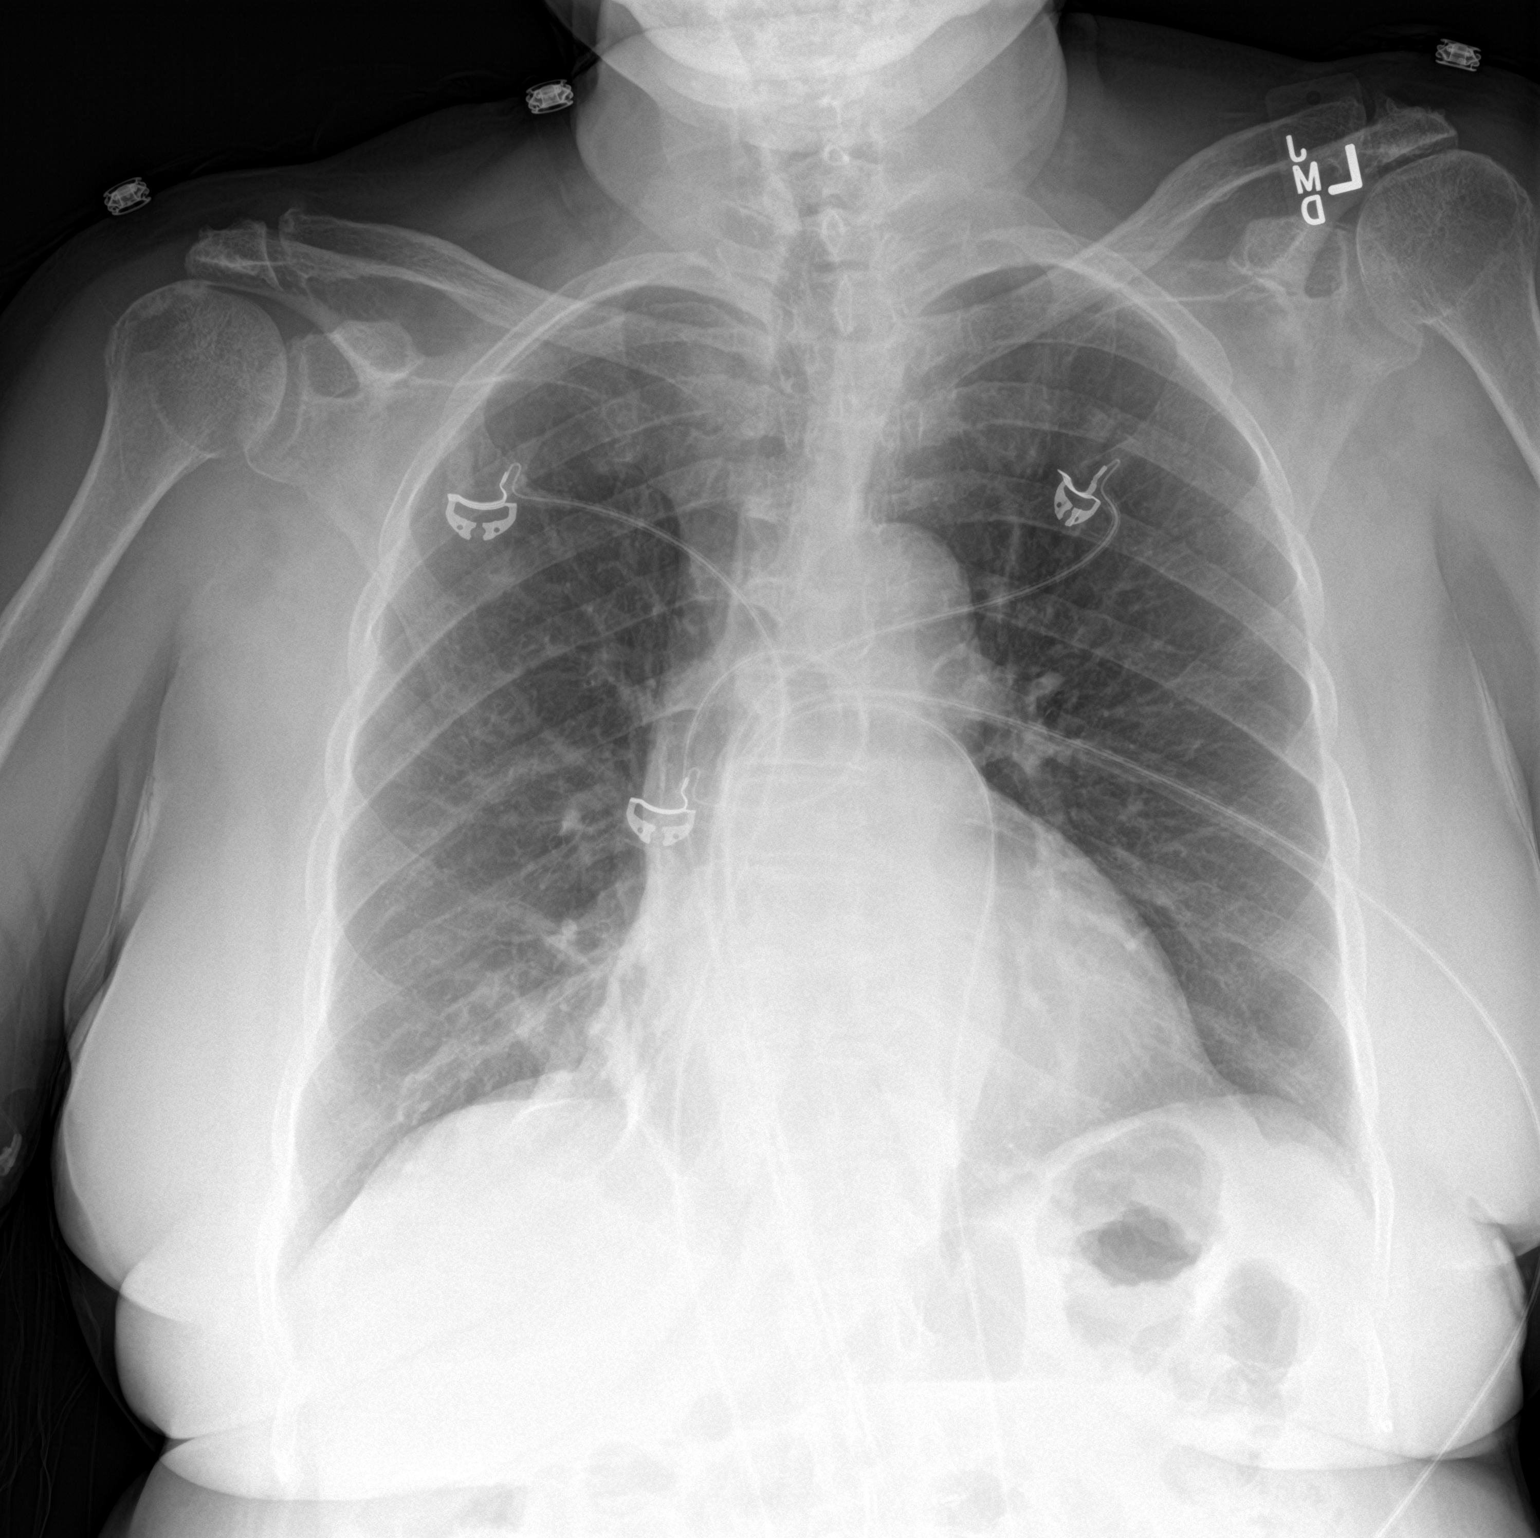

[chest lat]
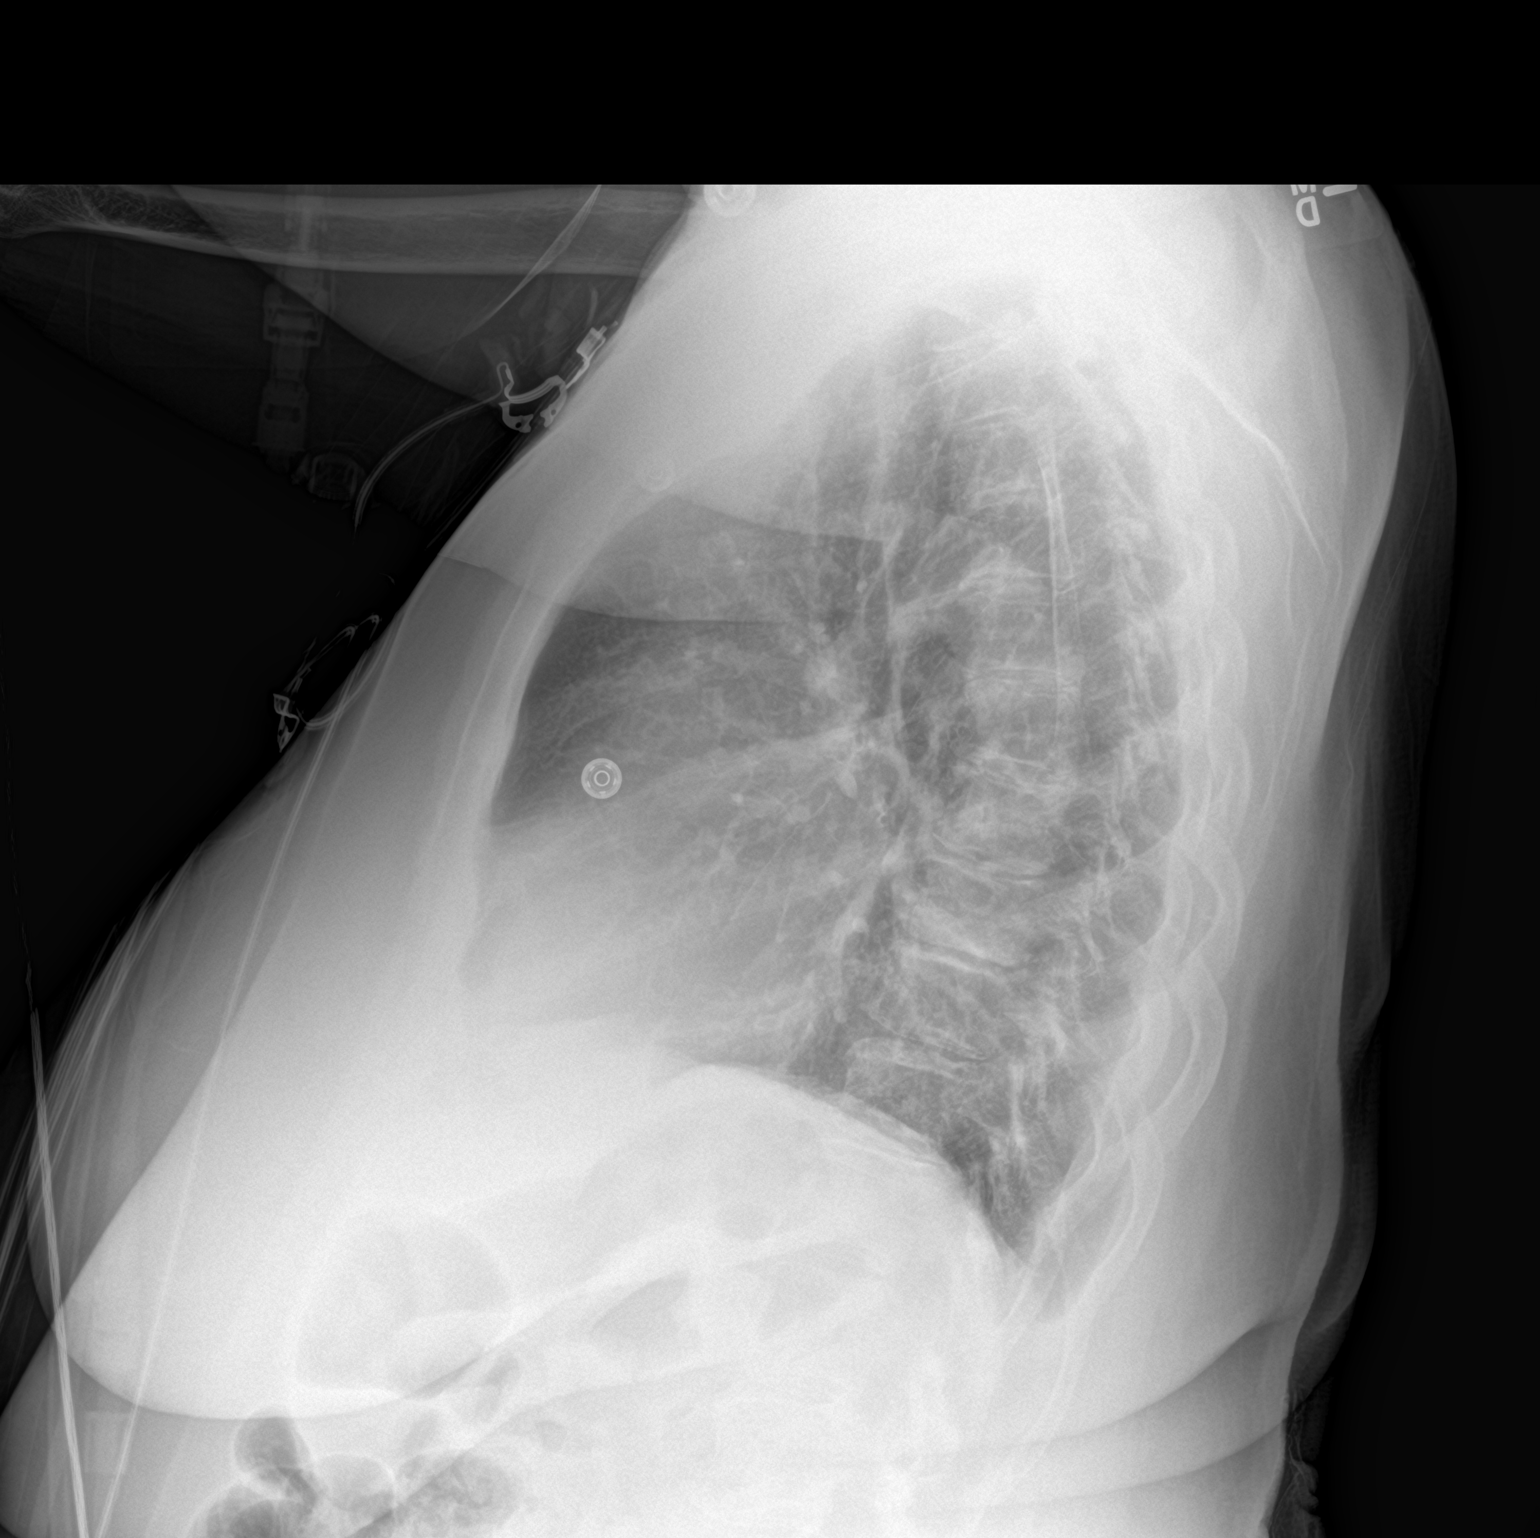

[2 of 2 positions shown; findings below may reference images not displayed]

FINDINGS: There is right basilar atelectasis. There is no pleural effusion or
pneumothorax. The heart and mediastinal contours are unremarkable.

There is no acute osseous abnormality.

There is osteoarthritis of the left glenohumeral joint. The
visualized skeletal structures are unremarkable.
IMPRESSION: Right basilar atelectasis.
